# Patient Record
Sex: Female | Born: 1955 | Race: White | Hispanic: No | Marital: Married | Smoking: Never smoker
Health system: Southern US, Community
[De-identification: ages and names within clinical notes are randomized; demographics above are authoritative.]

## PROBLEM LIST (undated history)

## (undated) DIAGNOSIS — R87629 Unspecified abnormal cytological findings in specimens from vagina: Secondary | ICD-10-CM

## (undated) DIAGNOSIS — N879 Dysplasia of cervix uteri, unspecified: Secondary | ICD-10-CM

## (undated) DIAGNOSIS — T7840XA Allergy, unspecified, initial encounter: Secondary | ICD-10-CM

## (undated) DIAGNOSIS — Z87442 Personal history of urinary calculi: Secondary | ICD-10-CM

## (undated) DIAGNOSIS — B029 Zoster without complications: Secondary | ICD-10-CM

## (undated) DIAGNOSIS — M810 Age-related osteoporosis without current pathological fracture: Secondary | ICD-10-CM

## (undated) HISTORY — DX: Age-related osteoporosis without current pathological fracture: M81.0

## (undated) HISTORY — DX: Unspecified abnormal cytological findings in specimens from vagina: R87.629

## (undated) HISTORY — DX: Allergy, unspecified, initial encounter: T78.40XA

## (undated) HISTORY — PX: HAMMER TOE SURGERY: SHX385

## (undated) HISTORY — PX: CATARACT EXTRACTION: SUR2

## (undated) HISTORY — DX: Dysplasia of cervix uteri, unspecified: N87.9

## (undated) HISTORY — PX: EYE SURGERY: SHX253

## (undated) HISTORY — PX: WRIST SURGERY: SHX841

## (undated) HISTORY — PX: OTHER SURGICAL HISTORY: SHX169

## (undated) HISTORY — DX: Zoster without complications: B02.9

## (undated) HISTORY — PX: BREAST BIOPSY: SHX20

---

## 1983-02-11 HISTORY — PX: LASER ABLATION OF THE CERVIX: SHX1949

## 2000-11-02 ENCOUNTER — Other Ambulatory Visit: Admission: RE | Admit: 2000-11-02 | Discharge: 2000-11-02 | Payer: Self-pay | Admitting: Obstetrics and Gynecology

## 2000-12-09 ENCOUNTER — Ambulatory Visit (HOSPITAL_COMMUNITY): Admission: RE | Admit: 2000-12-09 | Discharge: 2000-12-09 | Payer: Self-pay | Admitting: Obstetrics and Gynecology

## 2000-12-09 ENCOUNTER — Encounter: Payer: Self-pay | Admitting: Obstetrics and Gynecology

## 2001-12-10 ENCOUNTER — Ambulatory Visit (HOSPITAL_COMMUNITY): Admission: RE | Admit: 2001-12-10 | Discharge: 2001-12-10 | Payer: Self-pay | Admitting: Obstetrics and Gynecology

## 2001-12-10 ENCOUNTER — Encounter: Payer: Self-pay | Admitting: Obstetrics and Gynecology

## 2003-01-11 ENCOUNTER — Ambulatory Visit (HOSPITAL_COMMUNITY): Admission: RE | Admit: 2003-01-11 | Discharge: 2003-01-11 | Payer: Self-pay | Admitting: Obstetrics and Gynecology

## 2004-01-16 ENCOUNTER — Ambulatory Visit (HOSPITAL_COMMUNITY): Admission: RE | Admit: 2004-01-16 | Discharge: 2004-01-16 | Payer: Self-pay | Admitting: Obstetrics and Gynecology

## 2005-01-23 ENCOUNTER — Ambulatory Visit (HOSPITAL_COMMUNITY): Admission: RE | Admit: 2005-01-23 | Discharge: 2005-01-23 | Payer: Self-pay | Admitting: Obstetrics and Gynecology

## 2006-01-27 ENCOUNTER — Ambulatory Visit (HOSPITAL_COMMUNITY): Admission: RE | Admit: 2006-01-27 | Discharge: 2006-01-27 | Payer: Self-pay | Admitting: Obstetrics and Gynecology

## 2006-02-05 ENCOUNTER — Encounter: Admission: RE | Admit: 2006-02-05 | Discharge: 2006-02-05 | Payer: Self-pay | Admitting: Obstetrics and Gynecology

## 2006-02-11 ENCOUNTER — Encounter (INDEPENDENT_AMBULATORY_CARE_PROVIDER_SITE_OTHER): Payer: Self-pay | Admitting: *Deleted

## 2006-02-11 ENCOUNTER — Encounter: Admission: RE | Admit: 2006-02-11 | Discharge: 2006-02-11 | Payer: Self-pay | Admitting: Obstetrics and Gynecology

## 2006-08-19 ENCOUNTER — Encounter: Admission: RE | Admit: 2006-08-19 | Discharge: 2006-08-19 | Payer: Self-pay | Admitting: Obstetrics and Gynecology

## 2007-02-12 ENCOUNTER — Ambulatory Visit (HOSPITAL_COMMUNITY): Admission: RE | Admit: 2007-02-12 | Discharge: 2007-02-12 | Payer: Self-pay | Admitting: Obstetrics and Gynecology

## 2007-07-07 ENCOUNTER — Other Ambulatory Visit: Admission: RE | Admit: 2007-07-07 | Discharge: 2007-07-07 | Payer: Self-pay | Admitting: Obstetrics and Gynecology

## 2008-02-29 ENCOUNTER — Ambulatory Visit (HOSPITAL_COMMUNITY): Admission: RE | Admit: 2008-02-29 | Discharge: 2008-02-29 | Payer: Self-pay | Admitting: Obstetrics and Gynecology

## 2008-07-07 ENCOUNTER — Other Ambulatory Visit: Admission: RE | Admit: 2008-07-07 | Discharge: 2008-07-07 | Payer: Self-pay | Admitting: Obstetrics and Gynecology

## 2009-03-01 ENCOUNTER — Ambulatory Visit (HOSPITAL_COMMUNITY): Admission: RE | Admit: 2009-03-01 | Discharge: 2009-03-01 | Payer: Self-pay | Admitting: Obstetrics and Gynecology

## 2009-08-01 ENCOUNTER — Other Ambulatory Visit: Admission: RE | Admit: 2009-08-01 | Discharge: 2009-08-01 | Payer: Self-pay | Admitting: Obstetrics and Gynecology

## 2010-03-08 ENCOUNTER — Ambulatory Visit (HOSPITAL_COMMUNITY)
Admission: RE | Admit: 2010-03-08 | Discharge: 2010-03-08 | Payer: Self-pay | Source: Home / Self Care | Attending: Obstetrics and Gynecology | Admitting: Obstetrics and Gynecology

## 2010-08-21 ENCOUNTER — Other Ambulatory Visit: Payer: Self-pay | Admitting: Obstetrics and Gynecology

## 2010-08-21 ENCOUNTER — Other Ambulatory Visit (HOSPITAL_COMMUNITY)
Admission: RE | Admit: 2010-08-21 | Discharge: 2010-08-21 | Disposition: A | Discharge status: Home / Self Care | Payer: 59 | Source: Ambulatory Visit | Attending: Obstetrics and Gynecology | Admitting: Obstetrics and Gynecology

## 2010-08-21 DIAGNOSIS — Z01419 Encounter for gynecological examination (general) (routine) without abnormal findings: Secondary | ICD-10-CM | POA: Insufficient documentation

## 2011-02-10 ENCOUNTER — Other Ambulatory Visit: Payer: Self-pay | Admitting: Obstetrics and Gynecology

## 2011-02-10 DIAGNOSIS — Z139 Encounter for screening, unspecified: Secondary | ICD-10-CM

## 2011-03-10 ENCOUNTER — Ambulatory Visit (HOSPITAL_COMMUNITY)
Admission: RE | Admit: 2011-03-10 | Discharge: 2011-03-10 | Disposition: A | Discharge status: Home / Self Care | Payer: BC Managed Care – PPO | Source: Ambulatory Visit | Attending: Obstetrics and Gynecology | Admitting: Obstetrics and Gynecology

## 2011-03-10 DIAGNOSIS — Z1231 Encounter for screening mammogram for malignant neoplasm of breast: Secondary | ICD-10-CM | POA: Insufficient documentation

## 2011-03-10 DIAGNOSIS — Z139 Encounter for screening, unspecified: Secondary | ICD-10-CM

## 2011-08-27 ENCOUNTER — Other Ambulatory Visit: Payer: Self-pay | Admitting: Obstetrics and Gynecology

## 2011-08-27 ENCOUNTER — Other Ambulatory Visit (HOSPITAL_COMMUNITY)
Admission: RE | Admit: 2011-08-27 | Discharge: 2011-08-27 | Disposition: A | Discharge status: Home / Self Care | Payer: BC Managed Care – PPO | Source: Ambulatory Visit | Attending: Obstetrics and Gynecology | Admitting: Obstetrics and Gynecology

## 2011-08-27 DIAGNOSIS — Z01419 Encounter for gynecological examination (general) (routine) without abnormal findings: Secondary | ICD-10-CM | POA: Insufficient documentation

## 2012-02-10 ENCOUNTER — Other Ambulatory Visit: Payer: Self-pay | Admitting: Obstetrics and Gynecology

## 2012-02-10 DIAGNOSIS — Z139 Encounter for screening, unspecified: Secondary | ICD-10-CM

## 2012-03-11 ENCOUNTER — Ambulatory Visit (HOSPITAL_COMMUNITY): Payer: BC Managed Care – PPO

## 2012-03-16 ENCOUNTER — Ambulatory Visit (HOSPITAL_COMMUNITY): Payer: BC Managed Care – PPO

## 2012-03-23 ENCOUNTER — Ambulatory Visit (HOSPITAL_COMMUNITY)
Admission: RE | Admit: 2012-03-23 | Discharge: 2012-03-23 | Disposition: A | Discharge status: Home / Self Care | Payer: BC Managed Care – PPO | Source: Ambulatory Visit | Attending: Obstetrics and Gynecology | Admitting: Obstetrics and Gynecology

## 2012-03-23 DIAGNOSIS — Z1231 Encounter for screening mammogram for malignant neoplasm of breast: Secondary | ICD-10-CM | POA: Insufficient documentation

## 2012-03-23 DIAGNOSIS — Z139 Encounter for screening, unspecified: Secondary | ICD-10-CM

## 2012-07-27 ENCOUNTER — Telehealth (INDEPENDENT_AMBULATORY_CARE_PROVIDER_SITE_OTHER): Payer: Self-pay | Admitting: *Deleted

## 2012-07-27 ENCOUNTER — Encounter (INDEPENDENT_AMBULATORY_CARE_PROVIDER_SITE_OTHER): Payer: Self-pay | Admitting: *Deleted

## 2012-07-27 ENCOUNTER — Other Ambulatory Visit (INDEPENDENT_AMBULATORY_CARE_PROVIDER_SITE_OTHER): Payer: Self-pay | Admitting: *Deleted

## 2012-07-27 DIAGNOSIS — Z1211 Encounter for screening for malignant neoplasm of colon: Secondary | ICD-10-CM

## 2012-07-27 MED ORDER — PEG-KCL-NACL-NASULF-NA ASC-C 100 G PO SOLR: 1.0000 | Freq: Once | ORAL | Status: DC

## 2012-07-27 NOTE — Telephone Encounter (Signed)
Patient needs movi prep 

## 2012-08-24 ENCOUNTER — Telehealth (INDEPENDENT_AMBULATORY_CARE_PROVIDER_SITE_OTHER): Payer: Self-pay | Admitting: *Deleted

## 2012-08-24 NOTE — Telephone Encounter (Signed)
  Procedure: tcs  Reason/Indication:  screening  Has patient had this procedure before?  no  If so, when, by whom and where?    Is there a family history of colon cancer?  no  Who?  What age when diagnosed?    Is patient diabetic?   no      Does patient have prosthetic heart valve?  no  Do you have a pacemaker?  no  Has patient ever had endocarditis? no  Has patient had joint replacement within last 12 months?  no  Is patient on Coumadin, Plavix and/or Aspirin? no  Medications: vitamins  Allergies: pcn, sulfur  Medication Adjustment:   Procedure date & time: 09/15/12 at 915

## 2012-08-24 NOTE — Telephone Encounter (Signed)
agree

## 2012-09-01 ENCOUNTER — Encounter: Payer: Self-pay | Admitting: Obstetrics and Gynecology

## 2012-09-01 ENCOUNTER — Other Ambulatory Visit (HOSPITAL_COMMUNITY)
Admission: RE | Admit: 2012-09-01 | Discharge: 2012-09-01 | Disposition: A | Discharge status: Home / Self Care | Payer: BC Managed Care – PPO | Source: Ambulatory Visit | Attending: Obstetrics and Gynecology | Admitting: Obstetrics and Gynecology

## 2012-09-01 ENCOUNTER — Ambulatory Visit (INDEPENDENT_AMBULATORY_CARE_PROVIDER_SITE_OTHER): Payer: BC Managed Care – PPO | Admitting: Obstetrics and Gynecology

## 2012-09-01 VITALS — BP 138/80 | Ht 59.0 in | Wt 156.0 lb

## 2012-09-01 DIAGNOSIS — Z01419 Encounter for gynecological examination (general) (routine) without abnormal findings: Secondary | ICD-10-CM | POA: Insufficient documentation

## 2012-09-01 DIAGNOSIS — Z1212 Encounter for screening for malignant neoplasm of rectum: Secondary | ICD-10-CM

## 2012-09-01 DIAGNOSIS — Z1151 Encounter for screening for human papillomavirus (HPV): Secondary | ICD-10-CM | POA: Insufficient documentation

## 2012-09-01 DIAGNOSIS — Z8049 Family history of malignant neoplasm of other genital organs: Secondary | ICD-10-CM | POA: Insufficient documentation

## 2012-09-01 LAB — HEMOCCULT GUIAC POC 1CARD (OFFICE)

## 2012-09-01 NOTE — Progress Notes (Signed)
  Assessment:  Normal Gyn Exam postmeno   Plan:  1. pap smear done, next pap due annually, pt preference 2. return annually or prn  Subjective:  Roberta Barrett is a 57 y.o. female No obstetric history on file. who presents for annual exam.  The patient has complaints today of none  The following portions of the patient's history were reviewed and updated as appropriate: allergies, current medications, past family history, past medical history, past social history, past surgical history and problem list.  Review of Systems Pertinent items are noted in HPI.  Objective:  BP 138/80  Ht 4\' 11"  (1.499 m)  Wt 156 lb (70.761 kg)  BMI 31.49 kg/m2  BMI: Body mass index is 31.49 kg/(m^2). General Appearance: Alert, appropriate appearance for age. No acute distress HEENT: Grossly normal Neck / Thyroid:  Cardiovascular: RRR; normal S1, S2, no murmur Lungs: CTA bilaterally Back: No CVAT Breast Exam: No dimpling, nipple retraction or discharge. No masses or nodes. and No masses or nodes.No dimpling, nipple retraction or discharge. Gastrointestinal: Soft, non-tender, no masses or organomegaly Pelvic Exam: Vulva and vagina appear normal. Bimanual exam reveals normal uterus and adnexa.retroverted uterus, good support Rectovaginal: not indicated and guaiac negative stool obtained Lymphatic Exam: Non-palpable nodes in neck, clavicular, axillary, or inguinal regions Skin: no rash or abnormalities Neurologic: Normal gait and speech, no tremor  Psychiatric: Alert and oriented, appropriate affect.  Urinalysis:normal and Not done Labs reviewed from [facility] hra program Christin Bach. MD Pgr [phone] 12:06 PM

## 2012-09-01 NOTE — Patient Instructions (Signed)
Continue your excellent weight and exercise control.

## 2012-09-07 ENCOUNTER — Encounter (HOSPITAL_COMMUNITY): Payer: Self-pay | Admitting: Pharmacy Technician

## 2012-09-15 ENCOUNTER — Encounter (HOSPITAL_COMMUNITY): Payer: Self-pay | Admitting: *Deleted

## 2012-09-15 ENCOUNTER — Encounter (HOSPITAL_COMMUNITY)
Admission: RE | Disposition: A | Discharge status: Home Health | Payer: Self-pay | Source: Ambulatory Visit | Attending: Internal Medicine

## 2012-09-15 ENCOUNTER — Ambulatory Visit (HOSPITAL_COMMUNITY)
Admission: RE | Admit: 2012-09-15 | Discharge: 2012-09-15 | Disposition: A | Discharge status: Home Health | Payer: BC Managed Care – PPO | Source: Ambulatory Visit | Attending: Internal Medicine | Admitting: Internal Medicine

## 2012-09-15 DIAGNOSIS — J449 Chronic obstructive pulmonary disease, unspecified: Secondary | ICD-10-CM | POA: Insufficient documentation

## 2012-09-15 DIAGNOSIS — K644 Residual hemorrhoidal skin tags: Secondary | ICD-10-CM

## 2012-09-15 DIAGNOSIS — Z1211 Encounter for screening for malignant neoplasm of colon: Secondary | ICD-10-CM | POA: Insufficient documentation

## 2012-09-15 DIAGNOSIS — I10 Essential (primary) hypertension: Secondary | ICD-10-CM | POA: Insufficient documentation

## 2012-09-15 DIAGNOSIS — J4489 Other specified chronic obstructive pulmonary disease: Secondary | ICD-10-CM | POA: Insufficient documentation

## 2012-09-15 HISTORY — PX: COLONOSCOPY: SHX5424

## 2012-09-15 SURGERY — COLONOSCOPY
Anesthesia: Moderate Sedation

## 2012-09-15 MED ORDER — MEPERIDINE HCL 50 MG/ML IJ SOLN
INTRAMUSCULAR | Status: DC | PRN
  Administered 2012-09-15 (×2): 25 mg via INTRAVENOUS

## 2012-09-15 MED ORDER — STERILE WATER FOR IRRIGATION IR SOLN
Status: DC | PRN
  Administered 2012-09-15: 10:00:00

## 2012-09-15 MED ORDER — MIDAZOLAM HCL 5 MG/5ML IJ SOLN
INTRAMUSCULAR | Status: DC | PRN
  Administered 2012-09-15 (×2): 2 mg via INTRAVENOUS
  Administered 2012-09-15: 1 mg via INTRAVENOUS

## 2012-09-15 MED ORDER — MEPERIDINE HCL 50 MG/ML IJ SOLN
INTRAMUSCULAR | Status: AC
  Filled 2012-09-15: qty 1

## 2012-09-15 MED ORDER — SODIUM CHLORIDE 0.9 % IV SOLN
INTRAVENOUS | Status: DC
  Administered 2012-09-15: 1000 mL via INTRAVENOUS

## 2012-09-15 MED ORDER — MIDAZOLAM HCL 5 MG/5ML IJ SOLN
INTRAMUSCULAR | Status: AC
  Filled 2012-09-15: qty 10

## 2012-09-15 NOTE — Op Note (Signed)
COLONOSCOPY PROCEDURE REPORT  PATIENT:  Roberta Barrett  MR#:  [id number] Birthdate:  Oct 09, 1955, 57 y.o., female Endoscopist:  Dr. Malissa Hippo, MD Referred By:  Dr. Rudi Heap, MD  Procedure Date: 09/15/2012  Procedure:   Colonoscopy  Indications:  Patient is 57 year old Caucasian female who is undergoing average risk screening colonoscopy.  Informed Consent:  The procedure and risks were reviewed with the patient and informed consent was obtained.  Medications:  Demerol 50 mg IV Versed 5 mg IV  Description of procedure:  After a digital rectal exam was performed, that colonoscope was advanced from the anus through the rectum and colon to the area of the cecum, ileocecal valve and appendiceal orifice. The cecum was deeply intubated. These structures were well-seen and photographed for the record. From the level of the cecum and ileocecal valve, the scope was slowly and cautiously withdrawn. The mucosal surfaces were carefully surveyed utilizing scope tip to flexion to facilitate fold flattening as needed. The scope was pulled down into the rectum where a thorough exam including retroflexion was performed.  Findings:   Prep excellent. Normal mucosa of  various segments of colon and rectum. Small hemorrhoids below the dentate line.   Therapeutic/Diagnostic Maneuvers Performed:  None  Complications:  None  Cecal Withdrawal Time:  12 minutes  Impression:  Normal colonoscopy except small external hemorrhoids.  Recommendations:  Standard instructions given. Next screening exam in 10 years.  Leocadio Heal U  09/15/2012 9:56 AM  CC: Dr. Rudi Heap, MD & Dr. Bonnetta Barry ref. provider found

## 2012-09-15 NOTE — H&P (Signed)
Roberta Barrett is an 57 y.o. female.   Chief Complaint: Patient's here for colonoscopy. HPI: Patient is 57 year old Caucasian female who is here for average risk screening colonoscopy. She denies abdominal pain change in bowel habits or rectal bleeding. Family history is negative for CRC but her mother has had colonic polyps removed.  History reviewed. No pertinent past medical history.  Past Surgical History  Procedure Laterality Date  . Laser conization    . Laser ablation of the cervix  1985    Family History  Problem Relation Age of Onset  . Hypertension Mother   . COPD Father   . Cancer Daughter    Social History:  reports that she does not drink alcohol or use illicit drugs. Her tobacco history is not on file.  Allergies:  Allergies  Allergen Reactions  . Bee Venom Hives, Shortness Of Breath and Swelling  . Penicillins Rash  . Sulfur Rash    Medications Prior to Admission  Medication Sig Dispense Refill  . cholecalciferol (VITAMIN D) 1000 UNITS tablet Take 2,000 Units by mouth daily.      . Chromium-Cinnamon (CINNAMON PLUS CHROMIUM PO) Take 1 tablet by mouth daily.      Marland Kitchen CRANBERRY EXTRACT PO Take 1 tablet by mouth daily.      . Omega 3-6-9 Fatty Acids (OMEGA 3-6-9 COMPLEX PO) Take 1 capsule by mouth daily.        No results found for this or any previous visit (from the past 48 hour(s)). No results found.  ROS  Blood pressure 131/83, pulse 78, temperature 98.1 F (36.7 C), temperature source Oral, resp. rate 12, height 4\' 11"  (1.499 m), weight 156 lb (70.761 kg), SpO2 97.00%. Physical Exam  Constitutional: She appears well-developed and well-nourished.  HENT:  Mouth/Throat: Oropharynx is clear and moist.  Eyes: Conjunctivae are normal. No scleral icterus.  Neck: No thyromegaly present.  Cardiovascular: Normal rate, regular rhythm and normal heart sounds.   No murmur heard. Respiratory: Effort normal and breath sounds normal.  GI: Soft. Bowel sounds are  normal.  Musculoskeletal: She exhibits no edema.  Lymphadenopathy:    She has no cervical adenopathy.  Neurological: She is alert.  Skin: Skin is warm and dry.     Assessment/Plan Average risk screening colonoscopy.  Roberta Barrett U 09/15/2012, 9:27 AM

## 2012-09-17 ENCOUNTER — Encounter (HOSPITAL_COMMUNITY): Payer: Self-pay | Admitting: Internal Medicine

## 2012-11-02 ENCOUNTER — Telehealth: Payer: Self-pay | Admitting: Adult Health

## 2012-11-03 ENCOUNTER — Other Ambulatory Visit: Payer: Self-pay | Admitting: Adult Health

## 2012-11-03 MED ORDER — EPINEPHRINE 0.3 MG/0.3ML IJ SOAJ: 0.3000 mg | Freq: Once | INTRAMUSCULAR | Status: DC

## 2012-11-03 NOTE — Telephone Encounter (Signed)
Pt requesting refill on Epi-pen, stated Cyril Mourning, NP had refilled in past. Pt states leaving to go out of the state tomorrow.

## 2012-11-03 NOTE — Telephone Encounter (Signed)
Left message epi pen refilled

## 2013-02-15 ENCOUNTER — Other Ambulatory Visit: Payer: Self-pay | Admitting: Obstetrics and Gynecology

## 2013-02-15 DIAGNOSIS — Z139 Encounter for screening, unspecified: Secondary | ICD-10-CM

## 2013-03-24 ENCOUNTER — Ambulatory Visit (HOSPITAL_COMMUNITY): Payer: BC Managed Care – PPO

## 2013-03-25 ENCOUNTER — Ambulatory Visit (HOSPITAL_COMMUNITY)
Admission: RE | Admit: 2013-03-25 | Discharge: 2013-03-25 | Disposition: A | Discharge status: Home / Self Care | Payer: BC Managed Care – PPO | Source: Ambulatory Visit | Attending: Obstetrics and Gynecology | Admitting: Obstetrics and Gynecology

## 2013-03-25 DIAGNOSIS — Z139 Encounter for screening, unspecified: Secondary | ICD-10-CM

## 2013-03-25 DIAGNOSIS — Z1231 Encounter for screening mammogram for malignant neoplasm of breast: Secondary | ICD-10-CM | POA: Insufficient documentation

## 2013-09-05 ENCOUNTER — Encounter: Payer: Self-pay | Admitting: Obstetrics and Gynecology

## 2013-09-05 ENCOUNTER — Ambulatory Visit (INDEPENDENT_AMBULATORY_CARE_PROVIDER_SITE_OTHER): Payer: BC Managed Care – PPO | Admitting: Obstetrics and Gynecology

## 2013-09-05 ENCOUNTER — Other Ambulatory Visit (HOSPITAL_COMMUNITY)
Admission: RE | Admit: 2013-09-05 | Discharge: 2013-09-05 | Disposition: A | Discharge status: Home / Self Care | Payer: BC Managed Care – PPO | Source: Ambulatory Visit | Attending: Obstetrics and Gynecology | Admitting: Obstetrics and Gynecology

## 2013-09-05 VITALS — BP 138/86 | Ht 60.0 in | Wt 159.0 lb

## 2013-09-05 DIAGNOSIS — Z01419 Encounter for gynecological examination (general) (routine) without abnormal findings: Secondary | ICD-10-CM | POA: Insufficient documentation

## 2013-09-05 DIAGNOSIS — Z1151 Encounter for screening for human papillomavirus (HPV): Secondary | ICD-10-CM | POA: Insufficient documentation

## 2013-09-05 DIAGNOSIS — N898 Other specified noninflammatory disorders of vagina: Secondary | ICD-10-CM

## 2013-09-05 DIAGNOSIS — Z1212 Encounter for screening for malignant neoplasm of rectum: Secondary | ICD-10-CM

## 2013-09-05 DIAGNOSIS — N952 Postmenopausal atrophic vaginitis: Secondary | ICD-10-CM

## 2013-09-05 LAB — POCT WET PREP (WET MOUNT)
Bacteria Wet Prep HPF POC: NORMAL
TRICHOMONAS WET PREP HPF POC: NEGATIVE

## 2013-09-05 LAB — HEMOCCULT GUIAC POC 1CARD (OFFICE): Fecal Occult Blood, POC: NEGATIVE

## 2013-09-05 MED ORDER — ESTROGENS, CONJUGATED 0.625 MG/GM VA CREA
0.5000 g | TOPICAL_CREAM | Freq: Every day | VAGINAL | Status: DC
Start: 2013-09-05 — End: 2014-05-22

## 2013-09-05 NOTE — Progress Notes (Signed)
This chart was scribed by Leone PayorSonum Patel, Medical Scribe, for Dr. Christin BachJohn Jader Desai on 09/05/13 at 9:55 AM. This chart was reviewed by Dr. Christin BachJohn Jasime Westergren for accuracy.  Assessment:  Annual Gyn Exam Atrophic vaginitis., pt offered Premarin VC and accepts bid premarin VC 0.5 gm pv.   Plan:  1. pap smear done, next pap due 3 yr 2. return annually or prn 3    Annual mammogram advised Subjective:  Roberta LeatherwoodRobin P Barrett is a 58 y.o. female No obstetric history on file. who presents for annual exam. No LMP recorded. Patient is postmenopausal. The patient denies any problems or concerns at this time. She declines consideration of HT . Vag discharge noted, yellow  The following portions of the patient's history were reviewed and updated as appropriate: allergies, current medications, past family history, past medical history, past social history, past surgical history and problem list. Past Medical History  Diagnosis Date  . Dysplasia of cervix, unspecified 80's    Past Surgical History  Procedure Laterality Date  . Laser conization    . Laser ablation of the cervix  1985  . Colonoscopy N/A 09/15/2012    Procedure: COLONOSCOPY;  Surgeon: Malissa HippoNajeeb U Rehman, MD;  Location: [facility];  Service: Endoscopy;  Laterality: N/A;  915    Current outpatient prescriptions:cholecalciferol (VITAMIN D) 1000 UNITS tablet, Take 2,000 Units by mouth daily., Disp: , Rfl: ;  CRANBERRY EXTRACT PO, Take 1 tablet by mouth daily., Disp: , Rfl: ;  Omega 3-6-9 Fatty Acids (OMEGA 3-6-9 COMPLEX PO), Take 1 capsule by mouth daily., Disp: , Rfl: ;  EPINEPHrine (EPI-PEN) 0.3 mg/0.3 mL SOAJ injection, Inject 0.3 mLs (0.3 mg total) into the muscle once., Disp: 1 Device, Rfl: 1  Review of Systems Constitutional: negative Gastrointestinal: negative Genitourinary: negative   Objective:  BP 138/86  Ht 5' (1.524 m)  Wt 159 lb (72.122 kg)  BMI 31.05 kg/m2   BMI: Body mass index is 31.05 kg/(m^2).  General Appearance: Alert, appropriate  appearance for age. No acute distress HEENT: Grossly normal Neck / Thyroid:  Cardiovascular: RRR; normal S1, S2, no murmur Lungs: CTA bilaterally Back: No CVAT Breast Exam: No dimpling, nipple retraction or discharge. No masses or nodes., Normal to inspection, Normal breast tissue bilaterally and No masses or nodes.No dimpling, nipple retraction or discharge. Gastrointestinal: Soft, non-tender, no masses or organomegaly Pelvic Exam: External genitalia: normal general appearance Vaginal: atrophic mucosa and discharge, yellow Cervix: normal appearance Adnexa: normal bimanual exam Uterus: normal single, nontender and anteverted Rectovaginal: not indicated, normal rectal, no masses and guaiac negative stool obtained Lymphatic Exam: Non-palpable nodes in neck, clavicular, axillary, or inguinal regions Skin: no rash or abnormalities Neurologic: Normal gait and speech, no tremor  Psychiatric: Alert and oriented, appropriate affect.  Urinalysis:Not done  Christin BachJohn Zoe Creasman. MD Pgr [phone] 9:55 AM

## 2013-09-05 NOTE — Patient Instructions (Signed)

## 2013-09-06 LAB — CYTOLOGY - PAP

## 2013-12-07 ENCOUNTER — Ambulatory Visit: Payer: BC Managed Care – PPO | Admitting: Obstetrics and Gynecology

## 2014-02-27 ENCOUNTER — Other Ambulatory Visit: Payer: Self-pay | Admitting: Obstetrics and Gynecology

## 2014-02-27 DIAGNOSIS — Z1231 Encounter for screening mammogram for malignant neoplasm of breast: Secondary | ICD-10-CM

## 2014-03-29 ENCOUNTER — Ambulatory Visit (HOSPITAL_COMMUNITY)
Admission: RE | Admit: 2014-03-29 | Discharge: 2014-03-29 | Disposition: A | Discharge status: Home / Self Care | Payer: BLUE CROSS/BLUE SHIELD | Source: Ambulatory Visit | Attending: Obstetrics and Gynecology | Admitting: Obstetrics and Gynecology

## 2014-03-29 DIAGNOSIS — Z1231 Encounter for screening mammogram for malignant neoplasm of breast: Secondary | ICD-10-CM | POA: Insufficient documentation

## 2014-04-07 ENCOUNTER — Ambulatory Visit (INDEPENDENT_AMBULATORY_CARE_PROVIDER_SITE_OTHER): Payer: BLUE CROSS/BLUE SHIELD | Admitting: Urology

## 2014-04-07 DIAGNOSIS — N3941 Urge incontinence: Secondary | ICD-10-CM

## 2014-04-07 DIAGNOSIS — Z87440 Personal history of urinary (tract) infections: Secondary | ICD-10-CM

## 2014-05-18 ENCOUNTER — Telehealth: Payer: Self-pay | Admitting: Family Medicine

## 2014-05-18 NOTE — Telephone Encounter (Signed)
Appointment scheduled for Monday with Tiffany.

## 2014-05-22 ENCOUNTER — Ambulatory Visit (INDEPENDENT_AMBULATORY_CARE_PROVIDER_SITE_OTHER): Payer: BLUE CROSS/BLUE SHIELD | Admitting: Physician Assistant

## 2014-05-22 ENCOUNTER — Encounter: Payer: Self-pay | Admitting: Physician Assistant

## 2014-05-22 VITALS — BP 137/82 | HR 80 | Temp 97.4°F | Ht 60.0 in | Wt 162.0 lb

## 2014-05-22 DIAGNOSIS — M79605 Pain in left leg: Secondary | ICD-10-CM

## 2014-05-22 DIAGNOSIS — M79602 Pain in left arm: Secondary | ICD-10-CM

## 2014-05-22 MED ORDER — EPINEPHRINE 0.3 MG/0.3ML IJ SOAJ: 0.3000 mg | Freq: Once | INTRAMUSCULAR | Status: DC

## 2014-05-22 NOTE — Progress Notes (Signed)
   Subjective:    Patient ID: Roberta Barrett, female    DOB: 29-Aug-1955, 59 y.o.   MRN: [id number]  HPI 59 y/o female presents with pain in left leg after standing for long periods of time. Has tried occasional aleve which relieves the pain.     Review of Systems  Musculoskeletal: Positive for myalgias (pain in left lower leg).  Skin:       Mild, superficial varicose veins BLE  Neurological: Positive for weakness.  All other systems reviewed and are negative.      Objective:   Physical Exam  Constitutional: She is oriented to person, place, and time.  Musculoskeletal: Normal range of motion. She exhibits no edema or tenderness.  Neurological: She is alert and oriented to person, place, and time.  Skin: No rash noted. No erythema. No pallor.  No discoloration No ttp No edema Superficial varicose veins noted on exam   Nursing note and vitals reviewed.         Assessment & Plan:  1. Left leg pain: 2 Aleve BID x 14 days, then 1 Aleve BID x 14 days. F/U in 4 weeks for reassessment.

## 2014-05-22 NOTE — Patient Instructions (Signed)
Take 2 aleve twice daily x 2 weeks, then 1 aleve once daily x 2 weeks. Rest, ice, elevate when sitting.

## 2014-06-21 ENCOUNTER — Ambulatory Visit: Payer: BLUE CROSS/BLUE SHIELD | Admitting: Physician Assistant

## 2014-09-08 ENCOUNTER — Other Ambulatory Visit: Payer: BLUE CROSS/BLUE SHIELD | Admitting: Obstetrics and Gynecology

## 2014-09-13 ENCOUNTER — Other Ambulatory Visit (HOSPITAL_COMMUNITY)
Admission: RE | Admit: 2014-09-13 | Discharge: 2014-09-13 | Disposition: A | Discharge status: Home / Self Care | Payer: BLUE CROSS/BLUE SHIELD | Source: Ambulatory Visit | Attending: Obstetrics and Gynecology | Admitting: Obstetrics and Gynecology

## 2014-09-13 ENCOUNTER — Ambulatory Visit (INDEPENDENT_AMBULATORY_CARE_PROVIDER_SITE_OTHER): Payer: BLUE CROSS/BLUE SHIELD | Admitting: Obstetrics and Gynecology

## 2014-09-13 ENCOUNTER — Encounter: Payer: Self-pay | Admitting: Obstetrics and Gynecology

## 2014-09-13 VITALS — BP 142/80 | Ht 60.0 in | Wt 158.5 lb

## 2014-09-13 DIAGNOSIS — Z1151 Encounter for screening for human papillomavirus (HPV): Secondary | ICD-10-CM | POA: Insufficient documentation

## 2014-09-13 DIAGNOSIS — Z0000 Encounter for general adult medical examination without abnormal findings: Secondary | ICD-10-CM

## 2014-09-13 DIAGNOSIS — Z01419 Encounter for gynecological examination (general) (routine) without abnormal findings: Secondary | ICD-10-CM | POA: Diagnosis not present

## 2014-09-13 NOTE — Addendum Note (Signed)
Addended by: Richardson Chiquito on: 09/13/2014 10:04 AM   Modules accepted: Orders

## 2014-09-13 NOTE — Progress Notes (Signed)
Patient ID: Roberta Barrett, female   DOB: May 26, 1955, 59 y.o.   MRN: [id number]   Assessment:  Annual Gyn Exam   Plan:  1. pap smear done, next pap due annually (per patient) 2. return annually or prn 3    Annual mammogram advised Subjective:  Roberta Barrett is a 59 y.o. female No obstetric history on file. who presents for annual exam. No LMP recorded. Patient is postmenopausal. The patient has no complaints today. She is currently retired. Pt has a family history of cervical cancer (sister). Which metastasized  The following portions of the patient's history were reviewed and updated as appropriate: allergies, current medications, past family history, past medical history, past social history, past surgical history and problem list. Past Medical History  Diagnosis Date  . Dysplasia of cervix, unspecified 80's    Past Surgical History  Procedure Laterality Date  . Laser conization    . Laser ablation of the cervix  1985  . Colonoscopy N/A 09/15/2012    Procedure: COLONOSCOPY;  Surgeon: Malissa Hippo, MD;  Location: [facility];  Service: Endoscopy;  Laterality: N/A;  915     Current outpatient prescriptions:  .  cholecalciferol (VITAMIN D) 1000 UNITS tablet, Take 2,000 Units by mouth daily., Disp: , Rfl:  .  CRANBERRY EXTRACT PO, Take 1 tablet by mouth daily., Disp: , Rfl:  .  Omega 3-6-9 Fatty Acids (OMEGA 3-6-9 COMPLEX PO), Take 1 capsule by mouth daily., Disp: , Rfl:  .  Red Yeast Rice Extract (RED YEAST RICE PO), Take 1 tablet by mouth daily., Disp: , Rfl:  .  EPINEPHrine 0.3 mg/0.3 mL IJ SOAJ injection, Inject 0.3 mLs (0.3 mg total) into the muscle once. (Patient not taking: Reported on 09/13/2014), Disp: 1 Device, Rfl: 1  Review of Systems Constitutional: negative Gastrointestinal: negative Genitourinary: negative  Objective:  BP 142/80 mmHg  Ht 5' (1.524 m)  Wt 158 lb 8 oz (71.895 kg)  BMI 30.95 kg/m2   BMI: Body mass index is 30.95 kg/(m^2).  General  Appearance: Alert, appropriate appearance for age. No acute distress HEENT: Grossly normal Neck / Thyroid:  Cardiovascular: RRR; normal S1, S2, no murmur Lungs: CTA bilaterally Back: No CVAT Breast Exam: No dimpling, nipple retraction or discharge. No masses or nodes., Normal to inspection and Normal breast tissue bilaterally Gastrointestinal: Soft, non-tender, no masses or organomegaly Pelvic Exam: Vulva and vagina appear normal. Bimanual exam reveals normal uterus and adnexa. External genitalia: normal general appearance Vaginal: normal mucosa without prolapse or lesions and normal without tenderness, induration or masses atrophic tissues friable with speculum swipe Cervix: normal appearance Adnexa: normal bimanual exam Uterus: normal single, nontender; atrophic tissues, good support Rectovaginal: normal rectal, no masses and guaiac negative stool obtained Lymphatic Exam: Non-palpable nodes in neck, clavicular, axillary, or inguinal regions  Skin: no rash or abnormalities Neurologic: Normal gait and speech, no tremor  Psychiatric: Alert and oriented, appropriate affect.  Urinalysis:Not done Hemoccult: Negative  Christin Bach. MD Pgr [phone] 9:25 AM  This chart was scribed for Tilda Burrow, MD by Gwenyth Ober, Medical Scribe. This patient was seen in room 1 and the patient's care was started at 9:25 AM.    I personally performed the services described in this documentation, which was SCRIBED in my presence. The recorded information has been reviewed and considered accurate. It has been edited as necessary during review. Tilda Burrow, MD

## 2014-09-13 NOTE — Progress Notes (Signed)
Patient ID: Roberta Barrett, female   DOB: [birth year], 59 y.o.   MRN: [id number] Pt here today for her annual exam. Pt denies any problems or concerns at this time.

## 2014-09-14 LAB — CBC WITH DIFFERENTIAL/PLATELET
Basophils Absolute: 0.1 10*3/uL (ref 0.0–0.2)
Basos: 1 %
EOS (ABSOLUTE): 0.2 10*3/uL (ref 0.0–0.4)
EOS: 4 %
HEMATOCRIT: 39.5 % (ref 34.0–46.6)
HEMOGLOBIN: 13.2 g/dL (ref 11.1–15.9)
IMMATURE GRANS (ABS): 0 10*3/uL (ref 0.0–0.1)
IMMATURE GRANULOCYTES: 0 %
LYMPHS: 37 %
Lymphocytes Absolute: 1.7 10*3/uL (ref 0.7–3.1)
MCH: 26.7 pg (ref 26.6–33.0)
MCHC: 33.4 g/dL (ref 31.5–35.7)
MCV: 80 fL (ref 79–97)
MONOCYTES: 11 %
MONOS ABS: 0.5 10*3/uL (ref 0.1–0.9)
NEUTROS PCT: 47 %
Neutrophils Absolute: 2.2 10*3/uL (ref 1.4–7.0)
Platelets: 167 10*3/uL (ref 150–379)
RBC: 4.95 x10E6/uL (ref 3.77–5.28)
RDW: 15.7 % — ABNORMAL HIGH (ref 12.3–15.4)
WBC: 4.7 10*3/uL (ref 3.4–10.8)

## 2014-09-14 LAB — LIPID PANEL
CHOL/HDL RATIO: 3.9 ratio (ref 0.0–4.4)
CHOLESTEROL TOTAL: 197 mg/dL (ref 100–199)
HDL: 50 mg/dL (ref >39)
LDL Calculated: 111 mg/dL — ABNORMAL HIGH (ref 0–99)
TRIGLYCERIDES: 182 mg/dL — AB (ref 0–149)
VLDL Cholesterol Cal: 36 mg/dL (ref 5–40)

## 2014-09-14 LAB — COMPREHENSIVE METABOLIC PANEL
A/G RATIO: 1.5 (ref 1.1–2.5)
ALBUMIN: 4.5 g/dL (ref 3.5–5.5)
ALT: 14 IU/L (ref 0–32)
AST: 19 IU/L (ref 0–40)
Alkaline Phosphatase: 91 IU/L (ref 39–117)
BILIRUBIN TOTAL: 0.7 mg/dL (ref 0.0–1.2)
BUN / CREAT RATIO: 15 (ref 9–23)
BUN: 14 mg/dL (ref 6–24)
CALCIUM: 9.5 mg/dL (ref 8.7–10.2)
CO2: 22 mmol/L (ref 18–29)
CREATININE: 0.91 mg/dL (ref 0.57–1.00)
Chloride: 101 mmol/L (ref 97–108)
GFR calc Af Amer: 80 mL/min/{1.73_m2} (ref >59)
GFR calc non Af Amer: 70 mL/min/{1.73_m2} (ref >59)
Globulin, Total: 3.1 g/dL (ref 1.5–4.5)
Glucose: 92 mg/dL (ref 65–99)
Potassium: 5.3 mmol/L — ABNORMAL HIGH (ref 3.5–5.2)
SODIUM: 141 mmol/L (ref 134–144)
TOTAL PROTEIN: 7.6 g/dL (ref 6.0–8.5)

## 2014-09-14 LAB — TSH: TSH: 1.54 u[IU]/mL (ref 0.450–4.500)

## 2014-09-14 LAB — CYTOLOGY - PAP

## 2014-10-03 ENCOUNTER — Telehealth: Payer: Self-pay | Admitting: Obstetrics and Gynecology

## 2014-10-05 NOTE — Telephone Encounter (Signed)
i cannot change codes from screening to something else, it WAS screening

## 2014-10-06 NOTE — Telephone Encounter (Signed)
I contacted the pt and made her aware that Dr. Emelda Fear used a screening code and that he could not change the code to anything else. Pt verbalized understanding.

## 2014-12-08 ENCOUNTER — Encounter: Payer: Self-pay | Admitting: Adult Health

## 2014-12-08 ENCOUNTER — Ambulatory Visit (INDEPENDENT_AMBULATORY_CARE_PROVIDER_SITE_OTHER): Payer: BLUE CROSS/BLUE SHIELD | Admitting: Adult Health

## 2014-12-08 VITALS — BP 164/82 | HR 76 | Ht 60.0 in | Wt 160.0 lb

## 2014-12-08 DIAGNOSIS — B029 Zoster without complications: Secondary | ICD-10-CM

## 2014-12-08 HISTORY — DX: Zoster without complications: B02.9

## 2014-12-08 MED ORDER — VALACYCLOVIR HCL 1 G PO TABS: 1000.0000 mg | ORAL_TABLET | Freq: Three times a day (TID) | ORAL | Status: DC

## 2014-12-08 NOTE — Patient Instructions (Signed)

## 2014-12-08 NOTE — Progress Notes (Signed)
Subjective:     Patient ID: Roberta Barrett, female   DOB: 1956/02/01, 59 y.o.   MRN: [id number]  HPI Roberta Barrett is a 59 year old white female,married, worked in today for complaints of pain and pressure in left labia area since Tuesday and has spot on left buttock,she wonders if shingles.She has felt bad and achy.   Review of Systems Patient denies any headaches, hearing loss, fatigue, blurred vision, shortness of breath, chest pain, abdominal pain, problems with bowel movements, urination, or intercourse. No joint pain or mood swings.See HPI for positives.  Reviewed past medical,surgical, social and family history. Reviewed medications and allergies.     Objective:   Physical Exam BP 164/82 mmHg  Pulse 76  Ht 5' (1.524 m)  Wt 160 lb (72.576 kg)  BMI 31.25 kg/m2 Skin warm and dry.Pelvic: external genitalia has vesicles left labia, from clitoris to introitus and 1 spot left buttock, left labia swollen,firm like, and light red and very tender,culture swab used to area, vagina: has decreased moisture and rugae,atrophic,No lesions noted,urethra has no lesions or masses noted, cervix:smooth, uterus: normal size, shape and contour, non tender, no masses felt, adnexa: no masses or tenderness noted. Bladder is non tender and no masses felt. Dr Despina HiddenEure in for co exam   Discussed with her that it has herpetic look, will treat for shingles.She declines pain meds, wear loose clothing.   Assessment:     Shingles    Plan:    HSV culture sent Rx valtrex 1 gm #42 take 1 tid x 14 days Follow up in 10 days Review handout on shingles   Can use tylenol or advil for pain

## 2014-12-12 ENCOUNTER — Telehealth: Payer: Self-pay | Admitting: Adult Health

## 2014-12-12 LAB — HERPES SIMPLEX VIRUS CULTURE

## 2014-12-12 NOTE — Telephone Encounter (Signed)
Left message culture negative

## 2014-12-19 ENCOUNTER — Encounter: Payer: Self-pay | Admitting: Adult Health

## 2014-12-19 ENCOUNTER — Ambulatory Visit (INDEPENDENT_AMBULATORY_CARE_PROVIDER_SITE_OTHER): Payer: BLUE CROSS/BLUE SHIELD | Admitting: Adult Health

## 2014-12-19 VITALS — BP 140/86 | HR 80 | Ht 60.0 in | Wt 158.5 lb

## 2014-12-19 DIAGNOSIS — B029 Zoster without complications: Secondary | ICD-10-CM

## 2014-12-19 NOTE — Patient Instructions (Signed)
Follow up prn

## 2014-12-19 NOTE — Progress Notes (Signed)
Subjective:     Patient ID: Roberta Barrett, female   DOB: [birth year], 59 y.o.   MRN: [id number]  HPI Roberta Barrett is a 59 year old white female in for recheck of shingles left labia and buttock,taking valtrex,feels much better,but some discomfort still left labia.  Review of Systems Patient denies any headaches, hearing loss, fatigue, blurred vision, shortness of breath, chest pain, abdominal pain, problems with bowel movements, urination, or intercourse. No joint pain or mood swings. Reviewed past medical,surgical, social and family history. Reviewed medications and allergies.     Objective:   Physical Exam BP 140/86 mmHg  Pulse 80  Ht 5' (1.524 m)  Wt 158 lb 8 oz (71.895 kg)  BMI 30.95 kg/m2   Area of shingles on left labia and buttock have resolved,still has some discomfort left labia at times,no swelling,redness or vesicles now. Discussed this discomfort can continue for weeks to months,but she says it is so much better.  Assessment:     Shingles, resolved    Plan:     Finish valtrex Follow up prn

## 2015-02-02 ENCOUNTER — Ambulatory Visit (INDEPENDENT_AMBULATORY_CARE_PROVIDER_SITE_OTHER): Payer: BLUE CROSS/BLUE SHIELD | Admitting: Family

## 2015-02-02 ENCOUNTER — Encounter: Payer: Self-pay | Admitting: Family

## 2015-02-02 VITALS — BP 155/83 | HR 107 | Temp 98.8°F | Ht 61.52 in | Wt 159.2 lb

## 2015-02-02 DIAGNOSIS — J069 Acute upper respiratory infection, unspecified: Secondary | ICD-10-CM | POA: Diagnosis not present

## 2015-02-02 DIAGNOSIS — J029 Acute pharyngitis, unspecified: Secondary | ICD-10-CM

## 2015-02-02 LAB — POCT RAPID STREP A (OFFICE): Rapid Strep A Screen: NEGATIVE

## 2015-02-02 MED ORDER — FLUTICASONE PROPIONATE 50 MCG/ACT NA SUSP: 2.0000 | Freq: Every day | NASAL | Status: DC

## 2015-02-02 MED ORDER — AZITHROMYCIN 250 MG PO TABS: ORAL_TABLET | ORAL | Status: DC

## 2015-02-02 NOTE — Progress Notes (Signed)
Subjective:    Patient ID: Roberta Barrett, female    DOB: 11/20/1955, 59 y.o.   MRN: [id number]  Sore Throat  This is a new problem. The current episode started yesterday. The problem has been waxing and waning. The maximum temperature recorded prior to her arrival was 100.4 - 100.9 F. The pain is at a severity of 5/10. The pain is mild. Associated symptoms include congestion, headaches, a hoarse voice, a plugged ear sensation, swollen glands and trouble swallowing. Pertinent negatives include no coughing, ear discharge, ear pain or shortness of breath. She has had no exposure to strep or mono. She has tried NSAIDs for the symptoms. The treatment provided mild relief.  Fever  Associated symptoms include congestion and headaches. Pertinent negatives include no coughing or ear pain.      Review of Systems  Constitutional: Positive for fever.  HENT: Positive for congestion, hoarse voice and trouble swallowing. Negative for ear discharge and ear pain.   Eyes: Negative.   Respiratory: Negative.  Negative for cough and shortness of breath.   Cardiovascular: Negative.  Negative for palpitations.  Gastrointestinal: Negative.   Endocrine: Negative.   Genitourinary: Negative.   Musculoskeletal: Negative.   Neurological: Positive for headaches.  Hematological: Negative.   Psychiatric/Behavioral: Negative.   All other systems reviewed and are negative.      Objective:   Physical Exam  Constitutional: She is oriented to person, place, and time. She appears well-developed and well-nourished. No distress.  HENT:  Head: Normocephalic and atraumatic.  Right Ear: External ear normal.  Left Ear: External ear normal.  Nasal passage erythemas with moderate swelling Oropharynx erythemas    Eyes: Pupils are equal, round, and reactive to light.  Neck: Normal range of motion. Neck supple. No thyromegaly present.  Cardiovascular: Normal rate, regular rhythm, normal heart sounds and intact distal  pulses.   No murmur heard. Pulmonary/Chest: Effort normal and breath sounds normal. No respiratory distress. She has no wheezes.  Abdominal: Soft. Bowel sounds are normal. She exhibits no distension. There is no tenderness.  Musculoskeletal: Normal range of motion. She exhibits no edema or tenderness.  Neurological: She is alert and oriented to person, place, and time. She has normal reflexes. No cranial nerve deficit.  Skin: Skin is warm and dry.  Psychiatric: She has a normal mood and affect. Her behavior is normal. Judgment and thought content normal.  Vitals reviewed.     BP 155/83 mmHg  Pulse 107  Temp(Src) 98.8 F (37.1 C) (Oral)  Ht 5' 1.52" (1.563 m)  Wt 159 lb 3.2 oz (72.213 kg)  BMI 29.56 kg/m2     Assessment & Plan:  1. Sore throat - POCT rapid strep A - Culture, Group A Strep  2. Acute upper respiratory infection -- Take meds as prescribed - Use a cool mist humidifier  -Use saline nose sprays frequently -Saline irrigations of the nose can be very helpful if done frequently.  * 4X daily for 1 week*  * Use of a nettie pot can be helpful with this. Follow directions with this* -Force fluids -For any cough or congestion  Use plain Mucinex- regular strength or max strength is fine   * Children- consult with Pharmacist for dosing -For fever or aces or pains- take tylenol or ibuprofen appropriate for age and weight.  * for fevers greater than 101 orally you may alternate ibuprofen and tylenol every  3 hours. -Throat lozenges if help -New toothbrush in 3 days - azithromycin (ZITHROMAX Z-PAK)  250 MG tablet; As directed  Dispense: 1 each; Refill: 0 - fluticasone (FLONASE) 50 MCG/ACT nasal spray; Place 2 sprays into both nostrils daily.  Dispense: 16 g; Refill: 6  Jannifer Rodneyhristy Daton Szilagyi, FNP

## 2015-02-02 NOTE — Patient Instructions (Signed)
Upper Respiratory Infection, Adult Most upper respiratory infections (URIs) are a viral infection of the air passages leading to the lungs. A URI affects the nose, throat, and upper air passages. The most common type of URI is nasopharyngitis and is typically referred to as "the common cold." URIs run their course and usually go away on their own. Most of the time, a URI does not require medical attention, but sometimes a bacterial infection in the upper airways can follow a viral infection. This is called a secondary infection. Sinus and middle ear infections are common types of secondary upper respiratory infections. Bacterial pneumonia can also complicate a URI. A URI can worsen asthma and chronic obstructive pulmonary disease (COPD). Sometimes, these complications can require emergency medical care and may be life threatening.  CAUSES Almost all URIs are caused by viruses. A virus is a type of germ and can spread from one person to another.  RISKS FACTORS You may be at risk for a URI if:   You smoke.   You have chronic heart or lung disease.  You have a weakened defense (immune) system.   You are very young or very old.   You have nasal allergies or asthma.  You work in crowded or poorly ventilated areas.  You work in health care facilities or schools. SIGNS AND SYMPTOMS  Symptoms typically develop 2-3 days after you come in contact with a cold virus. Most viral URIs last 7-10 days. However, viral URIs from the influenza virus (flu virus) can last 14-18 days and are typically more severe. Symptoms may include:   Runny or stuffy (congested) nose.   Sneezing.   Cough.   Sore throat.   Headache.   Fatigue.   Fever.   Loss of appetite.   Pain in your forehead, behind your eyes, and over your cheekbones (sinus pain).  Muscle aches.  DIAGNOSIS  Your health care provider may diagnose a URI by:  Physical exam.  Tests to check that your symptoms are not due to  another condition such as:  Strep throat.  Sinusitis.  Pneumonia.  Asthma. TREATMENT  A URI goes away on its own with time. It cannot be cured with medicines, but medicines may be prescribed or recommended to relieve symptoms. Medicines may help:  Reduce your fever.  Reduce your cough.  Relieve nasal congestion. HOME CARE INSTRUCTIONS   Take medicines only as directed by your health care provider.   Gargle warm saltwater or take cough drops to comfort your throat as directed by your health care provider.  Use a warm mist humidifier or inhale steam from a shower to increase air moisture. This may make it easier to breathe.  Drink enough fluid to keep your urine clear or pale yellow.   Eat soups and other clear broths and maintain good nutrition.   Rest as needed.   Return to work when your temperature has returned to normal or as your health care provider advises. You may need to stay home longer to avoid infecting others. You can also use a face mask and careful hand washing to prevent spread of the virus.  Increase the usage of your inhaler if you have asthma.   Do not use any tobacco products, including cigarettes, chewing tobacco, or electronic cigarettes. If you need help quitting, ask your health care provider. PREVENTION  The best way to protect yourself from getting a cold is to practice good hygiene.   Avoid oral or hand contact with people with cold   symptoms.   Wash your hands often if contact occurs.  There is no clear evidence that vitamin C, vitamin E, echinacea, or exercise reduces the chance of developing a cold. However, it is always recommended to get plenty of rest, exercise, and practice good nutrition.  SEEK MEDICAL CARE IF:   You are getting worse rather than better.   Your symptoms are not controlled by medicine.   You have chills.  You have worsening shortness of breath.  You have brown or red mucus.  You have yellow or brown nasal  discharge.  You have pain in your face, especially when you bend forward.  You have a fever.  You have swollen neck glands.  You have pain while swallowing.  You have white areas in the back of your throat. SEEK IMMEDIATE MEDICAL CARE IF:   You have severe or persistent:  Headache.  Ear pain.  Sinus pain.  Chest pain.  You have chronic lung disease and any of the following:  Wheezing.  Prolonged cough.  Coughing up blood.  A change in your usual mucus.  You have a stiff neck.  You have changes in your:  Vision.  Hearing.  Thinking.  Mood. MAKE SURE YOU:   Understand these instructions.  Will watch your condition.  Will get help right away if you are not doing well or get worse.   This information is not intended to replace advice given to you by your health care provider. Make sure you discuss any questions you have with your health care provider.   Document Released: 07/23/2000 Document Revised: 06/13/2014 Document Reviewed: 05/04/2013 Elsevier Interactive Patient Education 2016 Elsevier Inc.  - Take meds as prescribed - Use a cool mist humidifier  -Use saline nose sprays frequently -Saline irrigations of the nose can be very helpful if done frequently.  * 4X daily for 1 week*  * Use of a nettie pot can be helpful with this. Follow directions with this* -Force fluids -For any cough or congestion  Use plain Mucinex- regular strength or max strength is fine   * Children- consult with Pharmacist for dosing -For fever or aces or pains- take tylenol or ibuprofen appropriate for age and weight.  * for fevers greater than 101 orally you may alternate ibuprofen and tylenol every  3 hours. -Throat lozenges if help -New toothbrush in 3 days   Akeyla Molden, FNP  

## 2015-02-04 LAB — CULTURE, GROUP A STREP: STREP A CULTURE: NEGATIVE

## 2015-02-23 ENCOUNTER — Other Ambulatory Visit: Payer: Self-pay | Admitting: Obstetrics and Gynecology

## 2015-02-23 DIAGNOSIS — Z1231 Encounter for screening mammogram for malignant neoplasm of breast: Secondary | ICD-10-CM

## 2015-04-02 ENCOUNTER — Ambulatory Visit (HOSPITAL_COMMUNITY)
Admission: RE | Admit: 2015-04-02 | Discharge: 2015-04-02 | Disposition: A | Discharge status: Home / Self Care | Payer: BLUE CROSS/BLUE SHIELD | Source: Ambulatory Visit | Attending: Obstetrics and Gynecology | Admitting: Obstetrics and Gynecology

## 2015-04-02 DIAGNOSIS — Z1231 Encounter for screening mammogram for malignant neoplasm of breast: Secondary | ICD-10-CM | POA: Insufficient documentation

## 2015-09-17 ENCOUNTER — Other Ambulatory Visit (HOSPITAL_COMMUNITY)
Admission: RE | Admit: 2015-09-17 | Discharge: 2015-09-17 | Disposition: A | Discharge status: Home / Self Care | Payer: BLUE CROSS/BLUE SHIELD | Source: Ambulatory Visit | Attending: Obstetrics and Gynecology | Admitting: Obstetrics and Gynecology

## 2015-09-17 ENCOUNTER — Ambulatory Visit (INDEPENDENT_AMBULATORY_CARE_PROVIDER_SITE_OTHER): Payer: BLUE CROSS/BLUE SHIELD | Admitting: Obstetrics and Gynecology

## 2015-09-17 ENCOUNTER — Encounter: Payer: Self-pay | Admitting: Obstetrics and Gynecology

## 2015-09-17 VITALS — BP 140/84 | Ht 60.0 in | Wt 158.5 lb

## 2015-09-17 DIAGNOSIS — Z1151 Encounter for screening for human papillomavirus (HPV): Secondary | ICD-10-CM | POA: Diagnosis not present

## 2015-09-17 DIAGNOSIS — Z01411 Encounter for gynecological examination (general) (routine) with abnormal findings: Secondary | ICD-10-CM | POA: Diagnosis present

## 2015-09-17 DIAGNOSIS — Z01419 Encounter for gynecological examination (general) (routine) without abnormal findings: Secondary | ICD-10-CM

## 2015-09-17 NOTE — Progress Notes (Signed)
Patient ID: Roberta Barrett, female   DOB: February 07, 1956, 60 y.o.   MRN: [id number]  Assessment:  Annual Gyn Exam   Plan:  1. pap smear done, next pap due in 1 year (per pts request)  2. return annually or prn 3    Annual mammogram and regular self exams advised 4. CBC, CMP, TSH and lipid panel ordered today   Subjective:  Roberta Barrett is a 60 y.o. female G0P0000 who presents for annual exam. No LMP recorded. Patient is postmenopausal.   The patient has no complaints today. She is requesting yearly lab work. She states she is occasionally sexually active. Pt is not currently taking hormone therapy.   The following portions of the patient's history were reviewed and updated as appropriate: allergies, current medications, past family history, past medical history, past social history, past surgical history and problem list.  Past Medical History:  Diagnosis Date  . Dysplasia of cervix, unspecified 80's  . Shingles 12/08/2014   Past Surgical History:  Procedure Laterality Date  . COLONOSCOPY N/A 09/15/2012   Procedure: COLONOSCOPY;  Surgeon: Malissa HippoNajeeb U Rehman, MD;  [facility];  Service: Endoscopy;  Laterality: N/A;  915  . LASER ABLATION OF THE CERVIX  1985  . laser conization      Current Outpatient Prescriptions:  .  cholecalciferol (VITAMIN D) 1000 UNITS tablet, Take 2,000 Units by mouth daily., Disp: , Rfl:  .  CRANBERRY EXTRACT PO, Take 1 tablet by mouth daily., Disp: , Rfl:  .  fluticasone (FLONASE) 50 MCG/ACT nasal spray, Place 2 sprays into both nostrils daily., Disp: 16 g, Rfl: 6 .  Omega 3-6-9 Fatty Acids (OMEGA 3-6-9 COMPLEX PO), Take 1 capsule by mouth daily., Disp: , Rfl:  .  EPINEPHrine 0.3 mg/0.3 mL IJ SOAJ injection, Inject 0.3 mLs (0.3 mg total) into the muscle once. (Patient not taking: Reported on 09/17/2015), Disp: 1 Device, Rfl: 1  Review of Systems Constitutional: negative Gastrointestinal: negative Genitourinary: negative   Objective:  BP  140/84   Ht 5' (1.524 m)   Wt 158 lb 8 oz (71.9 kg)   BMI 30.95 kg/m    BMI: Body mass index is 30.95 kg/m.  General Appearance: Alert, appropriate appearance for age. No acute distress HEENT: Grossly normal Neck / Thyroid:  Cardiovascular: RRR; normal S1, S2, no murmur Lungs: CTA bilaterally Back: No CVAT Breast Exam: No masses or nodes.No dimpling, nipple retraction or discharge. Gastrointestinal: Soft, non-tender, no masses or organomegaly Pelvic Exam:  External genitalia: normal general appearance Vaginal: normal mucosa without prolapse or lesions Cervix: normal appearance Adnexa: normal bimanual exam Uterus: normal single, nontender Rectovaginal: normal rectal, no masses and guaiac negative stool obtained Lymphatic Exam: Non-palpable nodes in neck, clavicular, axillary, or inguinal regions  Skin: no rash or abnormalities Neurologic: Normal gait and speech, no tremor  Psychiatric: Alert and oriented, appropriate affect.  Urinalysis:Not done  Guaiac negative   Roberta BachJohn Peytin Barrett. MD Pgr [phone] 9:50 AM    [name], I, [name], attest that this documentation has been prepared under the direction and in the presence of [name], MD. Electronically Signed: [name], ED Scribe. 09/17/15. 9:50 AM.  I personally performed the services described in this documentation, which was SCRIBED in my presence. The recorded information has been reviewed and considered accurate. It has been edited as necessary during review. [name],[name], MD

## 2015-09-18 LAB — CYTOLOGY - PAP

## 2015-09-18 LAB — CBC
HEMOGLOBIN: 13 g/dL (ref 11.1–15.9)
Hematocrit: 40.7 % (ref 34.0–46.6)
MCH: 26.1 pg — AB (ref 26.6–33.0)
MCHC: 31.9 g/dL (ref 31.5–35.7)
MCV: 82 fL (ref 79–97)
PLATELETS: 160 10*3/uL (ref 150–379)
RBC: 4.99 x10E6/uL (ref 3.77–5.28)
RDW: 16 % — AB (ref 12.3–15.4)
WBC: 4.3 10*3/uL (ref 3.4–10.8)

## 2015-09-18 LAB — COMPREHENSIVE METABOLIC PANEL
ALBUMIN: 4.5 g/dL (ref 3.5–5.5)
ALK PHOS: 88 IU/L (ref 39–117)
ALT: 19 IU/L (ref 0–32)
AST: 22 IU/L (ref 0–40)
Albumin/Globulin Ratio: 1.6 (ref 1.2–2.2)
BILIRUBIN TOTAL: 0.7 mg/dL (ref 0.0–1.2)
BUN / CREAT RATIO: 19 (ref 9–23)
BUN: 15 mg/dL (ref 6–24)
CHLORIDE: 100 mmol/L (ref 96–106)
CO2: 24 mmol/L (ref 18–29)
Calcium: 9.6 mg/dL (ref 8.7–10.2)
Creatinine, Ser: 0.79 mg/dL (ref 0.57–1.00)
GFR calc Af Amer: 95 mL/min/{1.73_m2} (ref >59)
GFR calc non Af Amer: 82 mL/min/{1.73_m2} (ref >59)
GLUCOSE: 84 mg/dL (ref 65–99)
Globulin, Total: 2.9 g/dL (ref 1.5–4.5)
Potassium: 4.5 mmol/L (ref 3.5–5.2)
Sodium: 140 mmol/L (ref 134–144)
Total Protein: 7.4 g/dL (ref 6.0–8.5)

## 2015-09-18 LAB — LIPID PANEL
Chol/HDL Ratio: 3.9 ratio units (ref 0.0–4.4)
Cholesterol, Total: 207 mg/dL — ABNORMAL HIGH (ref 100–199)
HDL: 53 mg/dL (ref >39)
LDL Calculated: 123 mg/dL — ABNORMAL HIGH (ref 0–99)
Triglycerides: 154 mg/dL — ABNORMAL HIGH (ref 0–149)
VLDL Cholesterol Cal: 31 mg/dL (ref 5–40)

## 2015-09-18 LAB — TSH: TSH: 2.29 u[IU]/mL (ref 0.450–4.500)

## 2015-10-12 ENCOUNTER — Telehealth: Payer: Self-pay | Admitting: Family Medicine

## 2015-10-16 MED ORDER — EPINEPHRINE 0.3 MG/0.3ML IJ SOAJ: 0.3000 mg | Freq: Once | INTRAMUSCULAR | 0 refills | Status: DC

## 2015-10-16 NOTE — Telephone Encounter (Signed)
Refilled epi pen.  I am listed as PCP but have never seen pt and she does not have an appt.   Murtis SinkSam Ritika Hellickson, MD [facility] 10/16/2015, 5:34 PM

## 2015-10-17 NOTE — Telephone Encounter (Signed)
Left detailed message rx request has been refilled.

## 2016-02-05 ENCOUNTER — Other Ambulatory Visit: Payer: Self-pay | Admitting: Family

## 2016-02-05 DIAGNOSIS — J069 Acute upper respiratory infection, unspecified: Secondary | ICD-10-CM

## 2016-02-07 ENCOUNTER — Other Ambulatory Visit: Payer: Self-pay | Admitting: Obstetrics and Gynecology

## 2016-02-07 DIAGNOSIS — Z1231 Encounter for screening mammogram for malignant neoplasm of breast: Secondary | ICD-10-CM

## 2016-04-02 ENCOUNTER — Ambulatory Visit (HOSPITAL_COMMUNITY)
Admission: RE | Admit: 2016-04-02 | Discharge: 2016-04-02 | Disposition: A | Discharge status: Home / Self Care | Payer: BLUE CROSS/BLUE SHIELD | Source: Ambulatory Visit | Attending: Obstetrics and Gynecology | Admitting: Obstetrics and Gynecology

## 2016-04-02 ENCOUNTER — Ambulatory Visit (HOSPITAL_COMMUNITY): Payer: BLUE CROSS/BLUE SHIELD

## 2016-04-02 DIAGNOSIS — Z1231 Encounter for screening mammogram for malignant neoplasm of breast: Secondary | ICD-10-CM

## 2016-09-18 ENCOUNTER — Ambulatory Visit (INDEPENDENT_AMBULATORY_CARE_PROVIDER_SITE_OTHER): Payer: Commercial Managed Care - PPO | Admitting: Adult Health

## 2016-09-18 ENCOUNTER — Other Ambulatory Visit (HOSPITAL_COMMUNITY)
Admission: RE | Admit: 2016-09-18 | Discharge: 2016-09-18 | Disposition: A | Discharge status: Home / Self Care | Payer: Commercial Managed Care - PPO | Source: Ambulatory Visit | Attending: Adult Health | Admitting: Adult Health

## 2016-09-18 ENCOUNTER — Encounter: Payer: Self-pay | Admitting: Adult Health

## 2016-09-18 VITALS — BP 140/80 | HR 78 | Ht 60.0 in | Wt 159.0 lb

## 2016-09-18 DIAGNOSIS — E7800 Pure hypercholesterolemia, unspecified: Secondary | ICD-10-CM | POA: Diagnosis not present

## 2016-09-18 DIAGNOSIS — Z01411 Encounter for gynecological examination (general) (routine) with abnormal findings: Secondary | ICD-10-CM

## 2016-09-18 DIAGNOSIS — Z1212 Encounter for screening for malignant neoplasm of rectum: Secondary | ICD-10-CM | POA: Diagnosis not present

## 2016-09-18 DIAGNOSIS — Z01419 Encounter for gynecological examination (general) (routine) without abnormal findings: Secondary | ICD-10-CM | POA: Diagnosis not present

## 2016-09-18 DIAGNOSIS — N950 Postmenopausal bleeding: Secondary | ICD-10-CM

## 2016-09-18 DIAGNOSIS — Z1211 Encounter for screening for malignant neoplasm of colon: Secondary | ICD-10-CM | POA: Diagnosis present

## 2016-09-18 LAB — HEMOCCULT GUIAC POC 1CARD (OFFICE): FECAL OCCULT BLD: NEGATIVE

## 2016-09-18 NOTE — Progress Notes (Signed)
Patient ID: Roberta Barrett, female   DOB: Nov 25, 1955, 61 y.o.   MRN: [id number] History of Present Illness: Roberta Barrett is a [age] white female, married, G0P0, in for a well woman gyn exam and pap.She is PM but wiped blood about 2 weeks ago. [facility].    Current Medications, Allergies, Past Medical History, Past Surgical History, Family History and Social History were reviewed in [facility] electronic medical record.     Review of Systems: Patient denies any headaches, hearing loss, fatigue, blurred vision, shortness of breath, chest pain, abdominal pain, problems with bowel movements, urination, or intercourse(not having often husband has diabetes. No joint pain or mood swings. Wiped blood about 2 weeks ago.   Physical Exam:BP 140/80 (BP Location: Left Arm, Patient Position: Sitting, Cuff Size: Small)   Pulse 78   Ht 5' (1.524 m)   Wt 159 lb (72.1 kg)   BMI 31.05 kg/m  General:  Well developed, well nourished, no acute distress Skin:  Warm and dry Neck:  Midline trachea, normal thyroid, good ROM, no lymphadenopathy,no carotid bruits heard Lungs; Clear to auscultation bilaterally Breast:  No dominant palpable mass, retraction, or nipple discharge Cardiovascular: Regular rate and rhythm Abdomen:  Soft, non tender, no hepatosplenomegaly Pelvic:  External genitalia is normal in appearance, no lesions.  The vagina is pale with loss of rugae, has brown discharge, without odor. Urethra has no lesions or masses. The cervix is atrophic, pap with HPV performed.  Uterus is felt to be normal size, shape, and contour.  No adnexal masses or tenderness noted.Bladder is non tender, no masses felt.Used small Pederson speculum.  Rectal: Good sphincter tone, no polyps, or hemorrhoids felt.  Hemoccult negative. Extremities/musculoskeletal:  No swelling or varicosities noted, no clubbing or cyanosis Psych:  No mood changes, alert and cooperative,seems happy PHQ 2 score 0.Will get US to  assess uterus and ovaries, and discussed with her if endometrial thickened will need endo biopsy.  Impression: 1. Encounter for routine gynecological examination with Papanicolaou smear of cervix   2. Screening for colorectal cancer   3. PMB (postmenopausal bleeding)   4. Elevated cholesterol       Plan: Review handout on PMB Check CBC,CMP,TSH and lipids Return in 1 week for GYN US Physical in 1 year, pap in 3 if normal Mammogram yearly Colonoscopy per GI

## 2016-09-18 NOTE — Addendum Note (Signed)
Addended by: Federico FlakeNES, PEGGY A on: 09/18/2016 10:21 AM   Modules accepted: Orders

## 2016-09-18 NOTE — Patient Instructions (Signed)
Postmenopausal Bleeding Postmenopausal bleeding is any bleeding a woman has after she has entered into menopause. Menopause is the end of a woman's fertile years. After menopause, a woman no longer ovulates or has menstrual periods. Postmenopausal bleeding can be caused by various things. Any type of postmenopausal bleeding, even if it appears to be a typical menstrual period, is concerning. This should be evaluated by your health care provider. Any treatment will depend on the cause of the bleeding. Follow these instructions at home: Monitor your condition for any changes. The following actions may help to alleviate any discomfort you are experiencing:  Avoid the use of tampons and douches as directed by your health care provider.  Change your pads frequently.  Get regular pelvic exams and Pap tests.  Keep all follow-up appointments for diagnostic tests as directed by your health care provider.  Contact a health care provider if:  Your bleeding lasts more than 1 week.  You have abdominal pain.  You have bleeding with sexual intercourse. Get help right away if:  You have a fever, chills, headache, dizziness, muscle aches, and bleeding.  You have severe pain with bleeding.  You are passing blood clots.  You have bleeding and need more than 1 pad an hour.  You feel faint. This information is not intended to replace advice given to you by your health care provider. Make sure you discuss any questions you have with your health care provider. Document Released: 05/07/2005 Document Revised: 07/05/2015 Document Reviewed: 08/26/2012 Elsevier Interactive Patient Education  2018 [employer] [place] in 1 week

## 2016-09-19 ENCOUNTER — Encounter: Payer: Self-pay | Admitting: Physician Assistant

## 2016-09-19 ENCOUNTER — Telehealth: Payer: Self-pay | Admitting: Adult Health

## 2016-09-19 ENCOUNTER — Ambulatory Visit (INDEPENDENT_AMBULATORY_CARE_PROVIDER_SITE_OTHER): Payer: Commercial Managed Care - PPO | Admitting: Physician Assistant

## 2016-09-19 VITALS — BP 148/87 | HR 84 | Temp 98.0°F | Ht 60.0 in | Wt 156.4 lb

## 2016-09-19 DIAGNOSIS — R490 Dysphonia: Secondary | ICD-10-CM

## 2016-09-19 DIAGNOSIS — J301 Allergic rhinitis due to pollen: Secondary | ICD-10-CM

## 2016-09-19 LAB — CBC
HEMATOCRIT: 36.3 % (ref 34.0–46.6)
Hemoglobin: 11.8 g/dL (ref 11.1–15.9)
MCH: 27.7 pg (ref 26.6–33.0)
MCHC: 32.5 g/dL (ref 31.5–35.7)
MCV: 85 fL (ref 79–97)
Platelets: 160 10*3/uL (ref 150–379)
RBC: 4.26 x10E6/uL (ref 3.77–5.28)
RDW: 14.2 % (ref 12.3–15.4)
WBC: 4.3 10*3/uL (ref 3.4–10.8)

## 2016-09-19 LAB — COMPREHENSIVE METABOLIC PANEL
ALK PHOS: 84 IU/L (ref 39–117)
ALT: 10 IU/L (ref 0–32)
AST: 22 IU/L (ref 0–40)
Albumin/Globulin Ratio: 1.5 (ref 1.2–2.2)
Albumin: 4.6 g/dL (ref 3.6–4.8)
BUN/Creatinine Ratio: 14 (ref 12–28)
BUN: 14 mg/dL (ref 8–27)
Bilirubin Total: 0.6 mg/dL (ref 0.0–1.2)
CALCIUM: 9.5 mg/dL (ref 8.7–10.3)
CO2: 23 mmol/L (ref 20–29)
CREATININE: 0.97 mg/dL (ref 0.57–1.00)
Chloride: 103 mmol/L (ref 96–106)
GFR calc Af Amer: 73 mL/min/{1.73_m2} (ref >59)
GFR, EST NON AFRICAN AMERICAN: 64 mL/min/{1.73_m2} (ref >59)
GLOBULIN, TOTAL: 3 g/dL (ref 1.5–4.5)
GLUCOSE: 95 mg/dL (ref 65–99)
Potassium: 4.2 mmol/L (ref 3.5–5.2)
SODIUM: 142 mmol/L (ref 134–144)
Total Protein: 7.6 g/dL (ref 6.0–8.5)

## 2016-09-19 LAB — LIPID PANEL
CHOL/HDL RATIO: 3.9 ratio (ref 0.0–4.4)
CHOLESTEROL TOTAL: 201 mg/dL — AB (ref 100–199)
HDL: 52 mg/dL (ref >39)
LDL Calculated: 106 mg/dL — ABNORMAL HIGH (ref 0–99)
TRIGLYCERIDES: 215 mg/dL — AB (ref 0–149)
VLDL Cholesterol Cal: 43 mg/dL — ABNORMAL HIGH (ref 5–40)

## 2016-09-19 LAB — TSH: TSH: 1.8 u[IU]/mL (ref 0.450–4.500)

## 2016-09-19 MED ORDER — MONTELUKAST SODIUM 10 MG PO TABS: 10.0000 mg | ORAL_TABLET | Freq: Every day | ORAL | 5 refills | Status: DC

## 2016-09-19 NOTE — Progress Notes (Signed)
BP (!) 148/87   Pulse 84   Temp 98 F (36.7 C) (Oral)   Ht 5' (1.524 m)   Wt 156 lb 6.4 oz (70.9 kg)   BMI 30.54 kg/m    Subjective:    Patient ID: Roberta Barrett, female    DOB: January 18, 1956, 61 y.o.   MRN: [id number]  HPI: Roberta Barrett is a 61 y.o. female presenting on 09/19/2016 for Hoarse  This patient has had hoarseness and voice change or greater than 6 weeks now. She knows she has significant seasonal allergies. They were quite severe this spring with the heavy pollen. She will have change of voice. She does not experience a sore throat. She does have postnasal drainage. She does have some swelling in the nose. She has not seen any blood or discoloration of the congestion. When she tried Zyrtec it made her too dry. She had used in the past. She tried Flonase next and had increased hoarseness.  Relevant past medical, surgical, family and social history reviewed and updated as indicated. Allergies and medications reviewed and updated.  Past Medical History:  Diagnosis Date  . Dysplasia of cervix, unspecified 80's  . Shingles 12/08/2014    Past Surgical History:  Procedure Laterality Date  . COLONOSCOPY N/A 09/15/2012   Procedure: COLONOSCOPY;  Surgeon: Malissa Hippo, MD;  [facility];  Service: Endoscopy;  Laterality: N/A;  915  . LASER ABLATION OF THE CERVIX  1985  . laser conization      Review of Systems  Constitutional: Negative.  Negative for activity change, fatigue and fever.  HENT: Positive for congestion, postnasal drip, rhinorrhea and voice change. Negative for sinus pain, sinus pressure, sore throat and trouble swallowing.   Eyes: Negative.   Respiratory: Negative.  Negative for cough.   Cardiovascular: Negative.  Negative for chest pain.  Gastrointestinal: Negative.  Negative for abdominal distention, abdominal pain, diarrhea, nausea and vomiting.  Endocrine: Negative.   Genitourinary: Negative.  Negative for dysuria.  Musculoskeletal:  Negative.   Skin: Negative.   Neurological: Negative.     Allergies as of 09/19/2016      Reactions   Bee Venom Hives, Shortness Of Breath, Swelling   Penicillins Rash   Sulfur Rash      Medication List       Accurate as of 09/19/16  2:41 PM. Always use your most recent med list.          cholecalciferol 1000 units tablet Commonly known as:  VITAMIN D Take 2,000 Units by mouth daily.   CRANBERRY EXTRACT PO Take 1 tablet by mouth daily.   fluticasone 50 MCG/ACT nasal spray Commonly known as:  FLONASE Place 2 sprays into both nostrils daily.   montelukast 10 MG tablet Commonly known as:  SINGULAIR Take 1 tablet (10 mg total) by mouth at bedtime.   OMEGA 3-6-9 COMPLEX PO Take 1 capsule by mouth daily.          Objective:    BP (!) 148/87   Pulse 84   Temp 98 F (36.7 C) (Oral)   Ht 5' (1.524 m)   Wt 156 lb 6.4 oz (70.9 kg)   BMI 30.54 kg/m   Allergies  Allergen Reactions  . Bee Venom Hives, Shortness Of Breath and Swelling  . Penicillins Rash  . Sulfur Rash    Physical Exam  Constitutional: She is oriented to person, place, and time. She appears well-developed and well-nourished.  HENT:  Head: Normocephalic and atraumatic.  Right Ear: Tympanic membrane is not injected and not retracted. A middle ear effusion is present.  Left Ear: Tympanic membrane is not injected and not retracted. A middle ear effusion is present.  Nose: Mucosal edema and rhinorrhea present.  Mouth/Throat: Posterior oropharyngeal edema and posterior oropharyngeal erythema present.  Eyes: Pupils are equal, round, and reactive to light. Conjunctivae and EOM are normal.  Cardiovascular: Normal rate, regular rhythm, normal heart sounds and intact distal pulses.   Pulmonary/Chest: Effort normal and breath sounds normal.  Abdominal: Soft. Bowel sounds are normal.  Neurological: She is alert and oriented to person, place, and time. She has normal reflexes.  Skin: Skin is warm and dry. No  rash noted.  Psychiatric: She has a normal mood and affect. Her behavior is normal. Judgment and thought content normal.       Assessment & Plan:   1. Hoarseness of voice  - Ambulatory referral to ENT  2. Non-seasonal allergic rhinitis due to pollen - Ambulatory referral to ENT    Current Outpatient Prescriptions:  .  cholecalciferol (VITAMIN D) 1000 UNITS tablet, Take 2,000 Units by mouth daily., Disp: , Rfl:  .  CRANBERRY EXTRACT PO, Take 1 tablet by mouth daily., Disp: , Rfl:  .  Omega 3-6-9 Fatty Acids (OMEGA 3-6-9 COMPLEX PO), Take 1 capsule by mouth daily., Disp: , Rfl:  .  fluticasone (FLONASE) 50 MCG/ACT nasal spray, Place 2 sprays into both nostrils daily. (Patient not taking: Reported on 09/19/2016), Disp: 16 g, Rfl: 0 .  montelukast (SINGULAIR) 10 MG tablet, Take 1 tablet (10 mg total) by mouth at bedtime., Disp: 30 tablet, Rfl: 5 Continue all other maintenance medications as listed above.  Follow up plan: Return if symptoms worsen or fail to improve.  Educational handout given for survey  Remus LofflerAngel S. Jaycee Pelzer PA-C [facility] [address]  [place], [place] [address] [phone]   09/19/2016, 2:41 PM

## 2016-09-19 NOTE — Patient Instructions (Signed)
Allergic Rhinitis Allergic rhinitis is when the mucous membranes in the nose respond to allergens. Allergens are particles in the air that cause your body to have an allergic reaction. This causes you to release allergic antibodies. Through a chain of events, these eventually cause you to release histamine into the blood stream. Although meant to protect the body, it is this release of histamine that causes your discomfort, such as frequent sneezing, congestion, and an itchy, runny nose. What are the causes? Seasonal allergic rhinitis (hay fever) is caused by pollen allergens that may come from grasses, trees, and weeds. Year-round allergic rhinitis (perennial allergic rhinitis) is caused by allergens such as house dust mites, pet dander, and mold spores. What are the signs or symptoms?  Nasal stuffiness (congestion).  Itchy, runny nose with sneezing and tearing of the eyes. How is this diagnosed? Your health care provider can help you determine the allergen or allergens that trigger your symptoms. If you and your health care provider are unable to determine the allergen, skin or blood testing may be used. Your health care provider will diagnose your condition after taking your health history and performing a physical exam. Your health care provider may assess you for other related conditions, such as asthma, pink eye, or an ear infection. How is this treated? Allergic rhinitis does not have a cure, but it can be controlled by:  Medicines that block allergy symptoms. These may include allergy shots, nasal sprays, and oral antihistamines.  Avoiding the allergen. Hay fever may often be treated with antihistamines in pill or nasal spray forms. Antihistamines block the effects of histamine. There are over-the-counter medicines that may help with nasal congestion and swelling around the eyes. Check with your health care provider before taking or giving this medicine. If avoiding the allergen or the  medicine prescribed do not work, there are many new medicines your health care provider can prescribe. Stronger medicine may be used if initial measures are ineffective. Desensitizing injections can be used if medicine and avoidance does not work. Desensitization is when a patient is given ongoing shots until the body becomes less sensitive to the allergen. Make sure you follow up with your health care provider if problems continue. Follow these instructions at home: It is not possible to completely avoid allergens, but you can reduce your symptoms by taking steps to limit your exposure to them. It helps to know exactly what you are allergic to so that you can avoid your specific triggers. Contact a health care provider if:  You have a fever.  You develop a cough that does not stop easily (persistent).  You have shortness of breath.  You start wheezing.  Symptoms interfere with normal daily activities. This information is not intended to replace advice given to you by your health care provider. Make sure you discuss any questions you have with your health care provider. Document Released: 10/22/2000 Document Revised: 09/28/2015 Document Reviewed: 10/04/2012 Elsevier Interactive Patient Education  2017 Elsevier Inc.  

## 2016-09-19 NOTE — Telephone Encounter (Signed)
Left message I called 

## 2016-09-22 ENCOUNTER — Ambulatory Visit (INDEPENDENT_AMBULATORY_CARE_PROVIDER_SITE_OTHER): Payer: Commercial Managed Care - PPO

## 2016-09-22 DIAGNOSIS — N950 Postmenopausal bleeding: Secondary | ICD-10-CM | POA: Diagnosis not present

## 2016-09-22 LAB — CYTOLOGY - PAP
DIAGNOSIS: NEGATIVE
HPV: NOT DETECTED

## 2016-09-22 NOTE — Telephone Encounter (Signed)
Pt aware that pap in negative for malignancy and HPV and labs were good and that US showed normal ovaries and ECC1.5 mm which is good and small calcification in uterus that you can keep

## 2016-09-22 NOTE — Progress Notes (Signed)
PELVIC US TA/TV: homogeneous retroverted uterus w/a 2.4 x 3 mm myometrial calcification mid/right,EEC 1.5 mm,normal ovaries bilat,right ovary appears mobile,unable to slide left ovary because of location (limited view),no free fluid,no pain during ultrasound

## 2016-09-26 ENCOUNTER — Other Ambulatory Visit: Payer: Commercial Managed Care - PPO

## 2016-10-27 ENCOUNTER — Telehealth: Payer: Self-pay | Admitting: Adult Health

## 2016-10-27 NOTE — Telephone Encounter (Signed)
Pt said Saturday had pinkish discharge and then yellowish, stomach felt funny, no itching or burning, no odor now, she is good now, if returns call and make appt.

## 2016-12-01 ENCOUNTER — Telehealth: Payer: Self-pay | Admitting: Adult Health

## 2016-12-01 NOTE — Telephone Encounter (Signed)
Patient called and left a message stating that she would like a call back from PocahontasJennifer, Patient did not state the reason why. Please contact pt

## 2016-12-01 NOTE — Telephone Encounter (Signed)
Called patient to get more information regarding her call but stated she just wanted to talk with Roberta Barrett.  Not an emergency and call can be returned tomorrow.

## 2016-12-02 NOTE — Telephone Encounter (Signed)
Roberta Barrett just letting me know that last week wiped lite pink, none since

## 2016-12-12 ENCOUNTER — Other Ambulatory Visit (HOSPITAL_COMMUNITY)
Admission: RE | Admit: 2016-12-12 | Discharge: 2016-12-12 | Disposition: A | Discharge status: Home / Self Care | Payer: Commercial Managed Care - PPO | Source: Other Acute Inpatient Hospital | Attending: Urology | Admitting: Urology

## 2016-12-12 ENCOUNTER — Ambulatory Visit (INDEPENDENT_AMBULATORY_CARE_PROVIDER_SITE_OTHER): Payer: Commercial Managed Care - PPO | Admitting: Urology

## 2016-12-12 DIAGNOSIS — R350 Frequency of micturition: Secondary | ICD-10-CM | POA: Insufficient documentation

## 2016-12-12 DIAGNOSIS — Z87440 Personal history of urinary (tract) infections: Secondary | ICD-10-CM | POA: Diagnosis not present

## 2016-12-12 DIAGNOSIS — Z87442 Personal history of urinary calculi: Secondary | ICD-10-CM | POA: Diagnosis not present

## 2016-12-12 LAB — URINALYSIS, ROUTINE W REFLEX MICROSCOPIC
BILIRUBIN URINE: NEGATIVE
Bacteria, UA: NONE SEEN
GLUCOSE, UA: NEGATIVE mg/dL
KETONES UR: NEGATIVE mg/dL
Leukocytes, UA: NEGATIVE
NITRITE: NEGATIVE
PH: 5 (ref 5.0–8.0)
Protein, ur: NEGATIVE mg/dL
Specific Gravity, Urine: 1.02 (ref 1.005–1.030)

## 2016-12-13 LAB — URINE CULTURE: Culture: NO GROWTH

## 2017-02-12 ENCOUNTER — Ambulatory Visit (INDEPENDENT_AMBULATORY_CARE_PROVIDER_SITE_OTHER): Payer: Commercial Managed Care - PPO | Admitting: *Deleted

## 2017-02-12 DIAGNOSIS — Z23 Encounter for immunization: Secondary | ICD-10-CM

## 2017-02-16 ENCOUNTER — Other Ambulatory Visit: Payer: Self-pay | Admitting: Obstetrics and Gynecology

## 2017-02-16 DIAGNOSIS — Z1231 Encounter for screening mammogram for malignant neoplasm of breast: Secondary | ICD-10-CM

## 2017-03-17 ENCOUNTER — Ambulatory Visit (INDEPENDENT_AMBULATORY_CARE_PROVIDER_SITE_OTHER): Payer: Commercial Managed Care - PPO | Admitting: Adult Health

## 2017-03-17 ENCOUNTER — Encounter: Payer: Self-pay | Admitting: Adult Health

## 2017-03-17 VITALS — BP 180/80 | HR 98 | Ht 60.0 in | Wt 163.5 lb

## 2017-03-17 DIAGNOSIS — B9689 Other specified bacterial agents as the cause of diseases classified elsewhere: Secondary | ICD-10-CM

## 2017-03-17 DIAGNOSIS — N760 Acute vaginitis: Secondary | ICD-10-CM | POA: Diagnosis not present

## 2017-03-17 DIAGNOSIS — N898 Other specified noninflammatory disorders of vagina: Secondary | ICD-10-CM

## 2017-03-17 LAB — POCT WET PREP (WET MOUNT)
CLUE CELLS WET PREP WHIFF POC: POSITIVE
WBC, Wet Prep HPF POC: POSITIVE

## 2017-03-17 MED ORDER — METRONIDAZOLE 500 MG PO TABS: 500.0000 mg | ORAL_TABLET | Freq: Two times a day (BID) | ORAL | 0 refills | Status: DC

## 2017-03-17 NOTE — Progress Notes (Signed)
Subjective:     Patient ID: Roberta LeatherwoodRobin P Barrett, female   DOB: 04/10/1955, 62 y.o.   MRN: [id number]  HPI Roberta BallRobin is a 62 year old white female in complaining of vaginal discharge with odor and some irritation.Has discharge at times but like this for about 2 weeks.  Review of Systems +vaginal discharge +vaginal odor +Vaginal irritation Reviewed past medical,surgical, social and family history. Reviewed medications and allergies.     Objective:   Physical Exam BP (!) 180/80 (BP Location: Right Arm, Patient Position: Sitting, Cuff Size: Normal)   Pulse 98   Ht 5' (1.524 m)   Wt 163 lb 8 oz (74.2 kg)   BMI 31.93 kg/m    Skin warm and dry.Pelvic: external genitalia is normal in appearance no lesions, vagina: tan to yellowish discharge with odor,urethra has no lesions or masses noted, cervix:smooth, uterus: normal size, shape and contour, non tender, no masses felt, adnexa: no masses or tenderness noted. Bladder is non tender and no masses felt. Wet prep: + for clue cells and +WBCs,parabasal cells too.  Assessment:     1. BV (bacterial vaginosis)   2. Vaginal discharge   3. Vaginal odor       Plan:     Rx flagyl 500 mg 1 bid x 7 days, no alcohol, or sex during treatment    F/U prn

## 2017-04-08 ENCOUNTER — Ambulatory Visit (HOSPITAL_COMMUNITY)
Admission: RE | Admit: 2017-04-08 | Discharge: 2017-04-08 | Disposition: A | Discharge status: Home / Self Care | Payer: Commercial Managed Care - PPO | Source: Ambulatory Visit | Attending: Obstetrics and Gynecology | Admitting: Obstetrics and Gynecology

## 2017-04-08 ENCOUNTER — Encounter (HOSPITAL_COMMUNITY): Payer: Self-pay

## 2017-04-08 DIAGNOSIS — Z1231 Encounter for screening mammogram for malignant neoplasm of breast: Secondary | ICD-10-CM | POA: Insufficient documentation

## 2017-08-03 ENCOUNTER — Telehealth: Payer: Self-pay | Admitting: Adult Health

## 2017-08-03 NOTE — Telephone Encounter (Signed)
She had red spotting Saturday x 1 she had worked out Thursday and Friday on trampoline, none now. Had US that was normal last year, but has noticed brown discharge at times.usually mid month, will make appt to check.

## 2017-08-03 NOTE — Telephone Encounter (Signed)
Spoke with pt. Pt states last year she had some spotting. She thinks it was vaginal atrophy. She jumped on trampoline Thursday and Friday for 15 minutes. She had some spotting Saturday. It just lasted Saturday. Also, she has a brown discharge every month, about the same time each month. She has noticed that for about 6 months. Lasts for a few day. What do you advise? Thanks!! JSY

## 2017-08-19 ENCOUNTER — Telehealth: Payer: Self-pay | Admitting: Adult Health

## 2017-08-19 NOTE — Telephone Encounter (Signed)
Left message to call office in am and get US scheduled to assess for for PMB, and keep appt in August

## 2017-08-31 ENCOUNTER — Other Ambulatory Visit: Payer: Self-pay | Admitting: Adult Health

## 2017-08-31 DIAGNOSIS — N950 Postmenopausal bleeding: Secondary | ICD-10-CM

## 2017-09-01 ENCOUNTER — Ambulatory Visit (INDEPENDENT_AMBULATORY_CARE_PROVIDER_SITE_OTHER): Payer: Commercial Managed Care - PPO

## 2017-09-01 DIAGNOSIS — N950 Postmenopausal bleeding: Secondary | ICD-10-CM | POA: Diagnosis not present

## 2017-09-01 DIAGNOSIS — N888 Other specified noninflammatory disorders of cervix uteri: Secondary | ICD-10-CM | POA: Diagnosis not present

## 2017-09-01 NOTE — Progress Notes (Addendum)
PELVIC US TA/TV: homogeneous retroverted uterus w/ a 2.4 mm myometrial calcification mid/right n/c,simple nabothian cyst .9 x .5 x .9 cm,normal ovaries bilat,ovaries appear mobile,no free fluid,no pain during ultrasound

## 2017-09-02 ENCOUNTER — Telehealth: Payer: Self-pay | Admitting: Adult Health

## 2017-09-02 NOTE — Telephone Encounter (Signed)
Pt aware US normal EEC 1.703mm, normal ovaries

## 2017-09-21 ENCOUNTER — Other Ambulatory Visit (HOSPITAL_COMMUNITY)
Admission: RE | Admit: 2017-09-21 | Discharge: 2017-09-21 | Disposition: A | Discharge status: Home / Self Care | Payer: Commercial Managed Care - PPO | Source: Ambulatory Visit | Attending: Adult Health | Admitting: Adult Health

## 2017-09-21 ENCOUNTER — Encounter: Payer: Self-pay | Admitting: Adult Health

## 2017-09-21 ENCOUNTER — Ambulatory Visit (INDEPENDENT_AMBULATORY_CARE_PROVIDER_SITE_OTHER): Payer: Commercial Managed Care - PPO | Admitting: Adult Health

## 2017-09-21 VITALS — BP 141/82 | HR 83 | Ht 59.25 in | Wt 162.5 lb

## 2017-09-21 DIAGNOSIS — E7800 Pure hypercholesterolemia, unspecified: Secondary | ICD-10-CM

## 2017-09-21 DIAGNOSIS — Z1212 Encounter for screening for malignant neoplasm of rectum: Secondary | ICD-10-CM | POA: Diagnosis not present

## 2017-09-21 DIAGNOSIS — Z1211 Encounter for screening for malignant neoplasm of colon: Secondary | ICD-10-CM | POA: Diagnosis not present

## 2017-09-21 DIAGNOSIS — Z01419 Encounter for gynecological examination (general) (routine) without abnormal findings: Secondary | ICD-10-CM

## 2017-09-21 LAB — HEMOCCULT GUIAC POC 1CARD (OFFICE): FECAL OCCULT BLD: NEGATIVE

## 2017-09-21 NOTE — Progress Notes (Signed)
Patient ID: Roberta LeatherwoodRobin P Barrett, female   DOB: 1955/09/04, 62 y.o.   MRN: [id number] History of Present Illness: Roberta BallRobin is a 62 year old white female,married, PM in for well woman gyn exam and pap and she wants fasting labs. [facility]CP [facility]   Current Medications, Allergies, Past Medical History, Past Surgical History, Family History and Social History were reviewed in [facility] electronic medical record.     Review of Systems: Patient denies any headaches, hearing loss, fatigue, blurred vision, shortness of breath, chest pain, abdominal pain, problems with bowel movements, urination, or intercourse. No joint pain or mood swings.    Physical Exam:BP (!) 141/82 (BP Location: Right Arm, Patient Position: Sitting, Cuff Size: Normal)   Pulse 83   Ht 4' 11.25" (1.505 m)   Wt 162 lb 8 oz (73.7 kg)   BMI 32.54 kg/m  General:  Well developed, well nourished, no acute distress Skin:  Warm and dry Neck:  Midline trachea, normal thyroid, good ROM, no lymphadenopathy,no carotid bruits heard Lungs; Clear to auscultation bilaterally Breast:  No dominant palpable mass, retraction, or nipple discharge Cardiovascular: Regular rate and rhythm Abdomen:  Soft, non tender, no hepatosplenomegaly Pelvic:  External genitalia is normal in appearance, no lesions.  The vagina is normal in appearance. Urethra has no lesions or masses. The cervix is nulliparous, pap with HPV performed at her request.  Uterus is felt to be normal size, shape, and contour.  No adnexal masses or tenderness noted.Bladder is non tender, no masses felt. Rectal: Good sphincter tone, no polyps, or hemorrhoids felt.  Hemoccult negative. Extremities/musculoskeletal:  No swelling or varicosities noted, no clubbing or cyanosis Psych:  No mood changes, alert and cooperative,seems happy PHQ 2 score 0.  Impression: 1. Encounter for routine gynecological examination with Papanicolaou smear of cervix   2. Screening for colorectal  cancer       Plan: Check CBC,CMP,TSH and lipids Physical in 1 year Pap in 3 if normal Mammogram yearly Colonoscopy per GI

## 2017-09-22 ENCOUNTER — Telehealth: Payer: Self-pay | Admitting: Adult Health

## 2017-09-22 LAB — LIPID PANEL
CHOLESTEROL TOTAL: 191 mg/dL (ref 100–199)
Chol/HDL Ratio: 3.5 ratio (ref 0.0–4.4)
HDL: 54 mg/dL (ref >39)
LDL Calculated: 125 mg/dL — ABNORMAL HIGH (ref 0–99)
TRIGLYCERIDES: 59 mg/dL (ref 0–149)
VLDL CHOLESTEROL CAL: 12 mg/dL (ref 5–40)

## 2017-09-22 LAB — CBC
HEMOGLOBIN: 11 g/dL — AB (ref 11.1–15.9)
Hematocrit: 33.3 % — ABNORMAL LOW (ref 34.0–46.6)
MCH: 26.7 pg (ref 26.6–33.0)
MCHC: 33 g/dL (ref 31.5–35.7)
MCV: 81 fL (ref 79–97)
PLATELETS: 178 10*3/uL (ref 150–450)
RBC: 4.12 x10E6/uL (ref 3.77–5.28)
RDW: 16.5 % — ABNORMAL HIGH (ref 12.3–15.4)
WBC: 4 10*3/uL (ref 3.4–10.8)

## 2017-09-22 LAB — COMPREHENSIVE METABOLIC PANEL
ALBUMIN: 4.2 g/dL (ref 3.6–4.8)
ALK PHOS: 91 IU/L (ref 39–117)
ALT: 14 IU/L (ref 0–32)
AST: 21 IU/L (ref 0–40)
Albumin/Globulin Ratio: 1.6 (ref 1.2–2.2)
BILIRUBIN TOTAL: 0.6 mg/dL (ref 0.0–1.2)
BUN / CREAT RATIO: 15 (ref 12–28)
BUN: 13 mg/dL (ref 8–27)
CHLORIDE: 105 mmol/L (ref 96–106)
CO2: 22 mmol/L (ref 20–29)
Calcium: 8.7 mg/dL (ref 8.7–10.3)
Creatinine, Ser: 0.88 mg/dL (ref 0.57–1.00)
GFR calc Af Amer: 82 mL/min/{1.73_m2} (ref >59)
GFR calc non Af Amer: 71 mL/min/{1.73_m2} (ref >59)
GLUCOSE: 95 mg/dL (ref 65–99)
Globulin, Total: 2.7 g/dL (ref 1.5–4.5)
Potassium: 4.5 mmol/L (ref 3.5–5.2)
Sodium: 140 mmol/L (ref 134–144)
Total Protein: 6.9 g/dL (ref 6.0–8.5)

## 2017-09-22 LAB — TSH: TSH: 1.94 u[IU]/mL (ref 0.450–4.500)

## 2017-09-22 NOTE — Telephone Encounter (Signed)
Left message to call me.

## 2017-09-22 NOTE — Telephone Encounter (Signed)
pt aware of labs, take centrum silver and eat a steak occasionally, but cholesterol looks good

## 2017-09-23 ENCOUNTER — Ambulatory Visit (INDEPENDENT_AMBULATORY_CARE_PROVIDER_SITE_OTHER): Payer: Commercial Managed Care - PPO | Admitting: *Deleted

## 2017-09-23 DIAGNOSIS — Z23 Encounter for immunization: Secondary | ICD-10-CM

## 2017-09-23 LAB — CYTOLOGY - PAP
Diagnosis: NEGATIVE
HPV (WINDOPATH): NOT DETECTED

## 2017-09-23 NOTE — Patient Instructions (Signed)
Live Zoster (Shingles) Vaccine, ZVL: What You Need to Know  1. What is shingles?  Shingles (also called herpes zoster, or just zoster) is a painful skin rash, often with blisters. Shingles is caused by the varicella zoster virus, the same virus that causes chickenpox. After you have chickenpox, the virus stays in your body and can cause shingles later in life.  You can't catch shingles from another person. However, a person who has never had chickenpox (or chickenpox vaccine) could get chickenpox from someone with shingles.  A shingles rash usually appears on one side of the face or body and heals within 2 to 4 weeks. Its main symptom is pain, which can be severe. Other symptoms can include fever, headache, chills, and upset stomach. Very rarely, a shingles infection can lead to pneumonia, hearing problems, blindness, brain inflammation (encephalitis), or death.  For about 1 person in 5, severe pain can continue even long after the rash has cleared up. This long-lasting pain is called post-herpetic neuralgia (PHN).  Shingles is far more common in people 50 years of age and older than in younger people, and the risk increases with age. It is also more common in people whose immune system is weakened because of a disease such as cancer or by drugs such as steroids or chemotherapy.  At least 1 million people a year in the [place] get shingles.  2. Shingles vaccine (live)  A live shingles vaccine was approved by FDA in 2006. In a clinical trial, the vaccine reduced the risk of shingles by about 50% in people 60 and older. It can reduce the likelihood of PHN, and reduce pain in some people who still get shingles after being vaccinated.  The recommended schedule for live shingles vaccine is a single dose for adults 60 years of age and older.  3. Some people should not get this vaccine  Tell your vaccine provider if you:  · Have any severe, life-threatening allergies. A person who has ever had a life-threatening  allergic reaction after a dose of live shingles vaccine, or has a severe allergy to any component of this vaccine, may be advised not to be vaccinated. Ask your health care provider if you want information about vaccine components.  · Are pregnant, or think you might be pregnant. Pregnant women should wait to get live shingles vaccine until they are no longer pregnant. Women should avoid getting pregnant for at least 1 month after getting shingles vaccine.  · Have a weakened immune system due to disease (such as cancer or AIDS) or medical treatments (such as radiation, immunotherapy, high-dose steroids, or chemotherapy).  · Are not feeling well. If you have a mild illness, such as a cold, you can probably get the vaccine today. If you are moderately or severely ill, you should probably wait until you recover. Your doctor can advise you.    4. Risks of a vaccine reaction  With any medicine, including vaccines, there is a chance of reactions.  After live shingles vaccination, a person might experience:  · Redness, soreness, swelling, or itching at the site of the injection  · Headache    These events are usually mild and go away on their own.  Rarely, live shingles vaccine can cause rash or shingles.  Other things that could happen after this vaccine:  · People sometimes faint after medical procedures, including vaccination. Sitting or lying down for about 15 minutes can help prevent fainting and injuries caused by a fall. Tell your   provider if you feel dizzy or have vision changes or ringing in the ears.  · Some people get shoulder pain that can be more severe and longer-lasting than routine soreness that can follow injections. This happens very rarely.  · Any medication can cause a severe allergic reaction. Such reactions to a vaccine are estimated at about 1 in a million doses, and would happen within a few minutes to a few hours after the vaccination.  As with any medicine, there is a very remote chance of a  vaccine causing a serious injury or death.  The safety of vaccines is always being monitored. For more information, visit: www.cdc.gov/vaccinesafety/  5. What if there is a serious problem?  What should I look for?  · Look for anything that concerns you, such as signs of a severe allergic reaction, very high fever, or unusual behavior.  Signs of a severe allergic reaction can include hives, swelling of the face and throat, difficulty breathing, a fast heartbeat, dizziness, and weakness. These would usually start a few minutes to a few hours after the vaccination.  What should I do?  · If you think it is a severe allergic reaction or other emergency that can't wait, call 9-1-1 and get to the nearest hospital. Otherwise, call your health care provider.  · Afterward, the reaction should be reported to the Vaccine Adverse Event Reporting System (VAERS). Your doctor should file this report, or you can do it yourself through the VAERS web site at www.vaers.hhs.gov or by calling 1-800-822-7967.  VAERS does not give medical advice.  6. How can I learn more?  · Ask your healthcare provider. He or she can give you the vaccine package insert or suggest other sources of information.  · Call your local or state health department.  · Contact the Centers for Disease Control and Prevention (CDC):  ? Call 1-800-232-4636(1-800-CDC-INFO) or  ? Visit CDC's website at www.cdc.gov/vaccines  CDC Vaccine Information Statement (VIS) Live Zoster Vaccine (03/24/2016)  This information is not intended to replace advice given to you by your health care provider. Make sure you discuss any questions you have with your health care provider.  Document Released: 11/24/2005 Document Revised: 04/08/2016 Document Reviewed: 04/08/2016  Elsevier Interactive Patient Education © 2018 Elsevier Inc.

## 2017-09-23 NOTE — Progress Notes (Signed)
Pt given Shingrix vaccine Tolerated well 

## 2017-09-30 ENCOUNTER — Telehealth: Payer: Self-pay | Admitting: *Deleted

## 2017-09-30 NOTE — Telephone Encounter (Signed)
Noted that HGB 14 per pt when gave blood on 8/6 and on 8/12 it was 11.0, take centrum silver

## 2017-10-21 ENCOUNTER — Other Ambulatory Visit: Payer: Self-pay | Admitting: Family Medicine

## 2017-10-22 MED ORDER — EPINEPHRINE 0.3 MG/0.3ML IJ SOAJ: 2.0000 mg | Freq: Once | INTRAMUSCULAR | 2 refills | Status: DC

## 2017-10-22 NOTE — Telephone Encounter (Signed)
Patient is still waiting.

## 2017-10-22 NOTE — Telephone Encounter (Signed)
rx sent to pharmacy

## 2017-10-22 NOTE — Telephone Encounter (Signed)
Pt aware.

## 2017-12-01 ENCOUNTER — Ambulatory Visit (INDEPENDENT_AMBULATORY_CARE_PROVIDER_SITE_OTHER): Payer: Commercial Managed Care - PPO | Admitting: *Deleted

## 2017-12-01 DIAGNOSIS — Z23 Encounter for immunization: Secondary | ICD-10-CM | POA: Diagnosis not present

## 2017-12-01 NOTE — Progress Notes (Signed)
Pt given 2nd Shingrix vaccine Tolerated well 

## 2017-12-18 ENCOUNTER — Ambulatory Visit (INDEPENDENT_AMBULATORY_CARE_PROVIDER_SITE_OTHER): Payer: Commercial Managed Care - PPO | Admitting: Urology

## 2017-12-18 ENCOUNTER — Other Ambulatory Visit (HOSPITAL_COMMUNITY)
Admission: RE | Admit: 2017-12-18 | Discharge: 2017-12-18 | Disposition: A | Discharge status: Home / Self Care | Payer: Commercial Managed Care - PPO | Source: Other Acute Inpatient Hospital | Attending: Urology | Admitting: Urology

## 2017-12-18 DIAGNOSIS — R3121 Asymptomatic microscopic hematuria: Secondary | ICD-10-CM | POA: Insufficient documentation

## 2017-12-18 DIAGNOSIS — Z87442 Personal history of urinary calculi: Secondary | ICD-10-CM | POA: Diagnosis not present

## 2017-12-18 DIAGNOSIS — N3941 Urge incontinence: Secondary | ICD-10-CM

## 2017-12-18 DIAGNOSIS — Z87440 Personal history of urinary (tract) infections: Secondary | ICD-10-CM | POA: Diagnosis not present

## 2017-12-18 LAB — URINALYSIS, COMPLETE (UACMP) WITH MICROSCOPIC
BILIRUBIN URINE: NEGATIVE
Glucose, UA: NEGATIVE mg/dL
KETONES UR: NEGATIVE mg/dL
LEUKOCYTES UA: NEGATIVE
Nitrite: NEGATIVE
PH: 5 (ref 5.0–8.0)
Protein, ur: NEGATIVE mg/dL
Specific Gravity, Urine: 1.016 (ref 1.005–1.030)

## 2018-03-02 ENCOUNTER — Ambulatory Visit (INDEPENDENT_AMBULATORY_CARE_PROVIDER_SITE_OTHER): Payer: Commercial Managed Care - PPO | Admitting: Family Medicine

## 2018-03-02 ENCOUNTER — Encounter: Payer: Self-pay | Admitting: Family Medicine

## 2018-03-02 VITALS — BP 142/84 | HR 97 | Temp 98.7°F | Ht 59.25 in | Wt 161.1 lb

## 2018-03-02 DIAGNOSIS — J40 Bronchitis, not specified as acute or chronic: Secondary | ICD-10-CM

## 2018-03-02 DIAGNOSIS — J329 Chronic sinusitis, unspecified: Secondary | ICD-10-CM

## 2018-03-02 MED ORDER — LEVOFLOXACIN 500 MG PO TABS: 500.0000 mg | ORAL_TABLET | Freq: Every day | ORAL | 0 refills | Status: DC

## 2018-03-02 NOTE — Progress Notes (Signed)
Chief Complaint  Patient presents with  . Chills    HPI  Patient presents today for dry cough runny stuffy nose. Diffuse headache of moderate intensity. Patient also has chills and subjective fever. Onset 5 days ago. Seemed to be getting better. However yesterday started blowing purrulent rhinorrhea and coughing purulent, green sputum. Fever to 100.9 last night. No dspnea.   PMH: Smoking status noted ROS: Per HPI  Objective: BP (!) 142/84   Pulse 97   Temp 98.7 F (37.1 C) (Oral)   Ht 4' 11.25" (1.505 m)   Wt 161 lb 2 oz (73.1 kg)   BMI 32.27 kg/m  Gen: NAD, alert, cooperative with exam HEENT: NCAT, EOMI, PERRL CV: RRR, good S1/S2, no murmur Resp: CTABL, no wheezes, non-labored Abd: SNTND, BS present, no guarding or organomegaly Ext: No edema, warm Neuro: Alert and oriented, No gross deficits  Assessment and plan:  1. Sinobronchitis     Meds ordered this encounter  Medications  . levofloxacin (LEVAQUIN) 500 MG tablet    Sig: Take 1 tablet (500 mg total) by mouth daily.    Dispense:  7 tablet    Refill:  0      Follow up as needed.  Mechele Claude, MD

## 2018-03-04 ENCOUNTER — Other Ambulatory Visit (HOSPITAL_COMMUNITY): Payer: Self-pay | Admitting: Adult Health

## 2018-03-04 DIAGNOSIS — Z1231 Encounter for screening mammogram for malignant neoplasm of breast: Secondary | ICD-10-CM

## 2018-04-12 ENCOUNTER — Ambulatory Visit (HOSPITAL_COMMUNITY)
Admission: RE | Admit: 2018-04-12 | Discharge: 2018-04-12 | Disposition: A | Discharge status: Home / Self Care | Payer: Commercial Managed Care - PPO | Source: Ambulatory Visit | Attending: Adult Health | Admitting: Adult Health

## 2018-04-12 DIAGNOSIS — Z1231 Encounter for screening mammogram for malignant neoplasm of breast: Secondary | ICD-10-CM

## 2018-09-13 ENCOUNTER — Other Ambulatory Visit: Payer: Self-pay | Admitting: Family Medicine

## 2018-09-13 ENCOUNTER — Telehealth: Payer: Self-pay | Admitting: Family Medicine

## 2018-09-13 MED ORDER — EPINEPHRINE 0.3 MG/0.3ML IJ SOAJ: 2.0000 mg | Freq: Once | INTRAMUSCULAR | 5 refills | Status: DC

## 2018-09-13 NOTE — Telephone Encounter (Signed)
What is the name of the medication? Epipen  Have you contacted your pharmacy to request a refill? yes  Which pharmacy would you like this sent to? [facility]    Patient notified that their request is being sent to the clinical staff for review and that they should receive a call once it is complete. If they do not receive a call within 24 hours they can check with their pharmacy or our office.

## 2018-09-13 NOTE — Telephone Encounter (Signed)
Not on current med list Last RF 10/2017

## 2018-09-13 NOTE — Telephone Encounter (Signed)
I sent in the requested prescription 

## 2018-09-14 ENCOUNTER — Other Ambulatory Visit: Payer: Self-pay

## 2018-09-14 NOTE — Telephone Encounter (Signed)
Patient aware.

## 2018-09-16 ENCOUNTER — Ambulatory Visit (INDEPENDENT_AMBULATORY_CARE_PROVIDER_SITE_OTHER): Payer: Commercial Managed Care - PPO | Admitting: *Deleted

## 2018-09-16 ENCOUNTER — Other Ambulatory Visit: Payer: Self-pay

## 2018-09-16 DIAGNOSIS — Z23 Encounter for immunization: Secondary | ICD-10-CM

## 2018-09-16 DIAGNOSIS — Z1159 Encounter for screening for other viral diseases: Secondary | ICD-10-CM

## 2018-09-16 DIAGNOSIS — Z114 Encounter for screening for human immunodeficiency virus [HIV]: Secondary | ICD-10-CM

## 2018-09-16 NOTE — Progress Notes (Signed)
Pt here for Tdap vaccine Pt also requesting HIV and Hep C screening to close health maintenance gaps [facility] reviewed with pt Tdap showing from [place] 07/2018 Per pt, this is in error Tdap given Pt tolerated well Labs drawn [facility] per Dr Livia Snellen

## 2018-09-17 LAB — HIV ANTIBODY (ROUTINE TESTING W REFLEX): HIV Screen 4th Generation wRfx: NONREACTIVE

## 2018-09-17 LAB — HEPATITIS C ANTIBODY: Hep C Virus Ab: <0.1 s/co ratio (ref 0.0–0.9)

## 2018-09-17 NOTE — Progress Notes (Signed)
Hello Pretty,  Your lab result is normal and/or stable.Some minor variations that are not significant are commonly marked abnormal, but do not represent any medical problem for you.  Best regards, Claretta Fraise, M.D.

## 2018-09-23 ENCOUNTER — Other Ambulatory Visit: Payer: Commercial Managed Care - PPO | Admitting: Adult Health

## 2018-12-02 ENCOUNTER — Ambulatory Visit (INDEPENDENT_AMBULATORY_CARE_PROVIDER_SITE_OTHER): Payer: Commercial Managed Care - PPO | Admitting: Adult Health

## 2018-12-02 ENCOUNTER — Other Ambulatory Visit (HOSPITAL_COMMUNITY)
Admission: RE | Admit: 2018-12-02 | Discharge: 2018-12-02 | Disposition: A | Discharge status: Home / Self Care | Payer: Commercial Managed Care - PPO | Source: Ambulatory Visit | Attending: Obstetrics & Gynecology | Admitting: Obstetrics & Gynecology

## 2018-12-02 ENCOUNTER — Encounter: Payer: Self-pay | Admitting: Adult Health

## 2018-12-02 ENCOUNTER — Other Ambulatory Visit: Payer: Self-pay

## 2018-12-02 VITALS — BP 154/89 | HR 74 | Ht 60.0 in | Wt 160.0 lb

## 2018-12-02 DIAGNOSIS — Z1212 Encounter for screening for malignant neoplasm of rectum: Secondary | ICD-10-CM

## 2018-12-02 DIAGNOSIS — E7800 Pure hypercholesterolemia, unspecified: Secondary | ICD-10-CM

## 2018-12-02 DIAGNOSIS — Z01419 Encounter for gynecological examination (general) (routine) without abnormal findings: Secondary | ICD-10-CM | POA: Diagnosis present

## 2018-12-02 DIAGNOSIS — Z1211 Encounter for screening for malignant neoplasm of colon: Secondary | ICD-10-CM

## 2018-12-02 LAB — HEMOCCULT GUIAC POC 1CARD (OFFICE): Fecal Occult Blood, POC: NEGATIVE

## 2018-12-02 NOTE — Progress Notes (Signed)
Patient ID: Roberta Barrett, female   DOB: Jan 01, 1956, 63 y.o.   MRN: [id number] History of Present Illness: Roberta Barrett is a 63 year old white female, married, PM in for a well woman gyn exam and she requests pap. PCP is Dr Livia Snellen.   Current Medications, Allergies, Past Medical History, Past Surgical History, Family History and Social History were reviewed in [employer] record.     Review of Systems:  Patient denies any headaches, hearing loss, fatigue, blurred vision, shortness of breath, chest pain, abdominal pain, problems with bowel movements, urination, or intercourse. No joint pain or mood swings.   Physical Exam:BP (!) 154/89 (BP Location: Right Arm, Patient Position: Sitting, Cuff Size: Normal)   Pulse 74   Ht 5' (1.524 m)   Wt 160 lb (72.6 kg)   BMI 31.25 kg/m  General:  Well developed, well nourished, no acute distress Skin:  Warm and dry Neck:  Midline trachea, normal thyroid, good ROM, no lymphadenopathy,no carotid bruits heard Lungs; Clear to auscultation bilaterally Breast:  No dominant palpable mass, retraction, or nipple discharge Cardiovascular: Regular rate and rhythm, has +murmur Abdomen:  Soft, non tender, no hepatosplenomegaly Pelvic:  External genitalia is normal in appearance, no lesions.  The vagina is atrophic. Urethra has no lesions or masses. The cervix atrophic, pap with High risk HPV 16/18 genotyping performed.  Uterus is felt to be normal size, shape, and contour.  No adnexal masses or tenderness noted.Bladder is non tender, no masses felt. Rectal: Good sphincter tone, no polyps, or hemorrhoids felt.  Hemoccult negative. Extremities/musculoskeletal:  No swelling or varicosities noted, no clubbing or cyanosis Psych:  No mood changes, alert and cooperative,seems happy Fall risk is low PHQ 9 score is 0. Examination chaperoned by Weyman Croon FNP student who assisted with exam.  Impression and Plan: 1. Encounter for gynecological  examination with Papanicolaou smear of cervix Pap sent Physical in 1 year Pap in 3 if normal Check CBC,CMP,TSH and lipids Mammogram yearly Colonoscopy  per GI  2. Screening for colorectal cancer   3. Elevated cholesterol Check lipids

## 2018-12-02 NOTE — Addendum Note (Signed)
Addended by: Diona Fanti A on: 12/02/2018 11:15 AM   Modules accepted: Orders

## 2018-12-03 ENCOUNTER — Telehealth: Payer: Self-pay | Admitting: Adult Health

## 2018-12-03 LAB — COMPREHENSIVE METABOLIC PANEL
ALT: 22 IU/L (ref 0–32)
AST: 25 IU/L (ref 0–40)
Albumin/Globulin Ratio: 1.5 (ref 1.2–2.2)
Albumin: 4.5 g/dL (ref 3.8–4.8)
Alkaline Phosphatase: 91 IU/L (ref 39–117)
BUN/Creatinine Ratio: 13 (ref 12–28)
BUN: 13 mg/dL (ref 8–27)
Bilirubin Total: 0.5 mg/dL (ref 0.0–1.2)
CO2: 21 mmol/L (ref 20–29)
Calcium: 9.5 mg/dL (ref 8.7–10.3)
Chloride: 104 mmol/L (ref 96–106)
Creatinine, Ser: 0.97 mg/dL (ref 0.57–1.00)
GFR calc Af Amer: 72 mL/min/{1.73_m2} (ref >59)
GFR calc non Af Amer: 63 mL/min/{1.73_m2} (ref >59)
Globulin, Total: 3.1 g/dL (ref 1.5–4.5)
Glucose: 90 mg/dL (ref 65–99)
Potassium: 4 mmol/L (ref 3.5–5.2)
Sodium: 139 mmol/L (ref 134–144)
Total Protein: 7.6 g/dL (ref 6.0–8.5)

## 2018-12-03 LAB — CBC
Hematocrit: 33.5 % — ABNORMAL LOW (ref 34.0–46.6)
Hemoglobin: 10.6 g/dL — ABNORMAL LOW (ref 11.1–15.9)
MCH: 23.9 pg — ABNORMAL LOW (ref 26.6–33.0)
MCHC: 31.6 g/dL (ref 31.5–35.7)
MCV: 76 fL — ABNORMAL LOW (ref 79–97)
Platelets: 175 10*3/uL (ref 150–450)
RBC: 4.44 x10E6/uL (ref 3.77–5.28)
RDW: 15.2 % (ref 11.7–15.4)
WBC: 4.9 10*3/uL (ref 3.4–10.8)

## 2018-12-03 LAB — LIPID PANEL
Chol/HDL Ratio: 3.4 ratio (ref 0.0–4.4)
Cholesterol, Total: 180 mg/dL (ref 100–199)
HDL: 53 mg/dL (ref >39)
LDL Chol Calc (NIH): 107 mg/dL — ABNORMAL HIGH (ref 0–99)
Triglycerides: 114 mg/dL (ref 0–149)
VLDL Cholesterol Cal: 20 mg/dL (ref 5–40)

## 2018-12-03 LAB — TSH: TSH: 1.52 u[IU]/mL (ref 0.450–4.500)

## 2018-12-03 NOTE — Telephone Encounter (Signed)
Pt aware of labs and that HGB 10.6 she said  gave blood several weeks ago and it was 12.6

## 2018-12-07 LAB — CYTOLOGY - PAP
Comment: NEGATIVE
Diagnosis: NEGATIVE
High risk HPV: NEGATIVE

## 2019-01-21 ENCOUNTER — Other Ambulatory Visit: Payer: Self-pay

## 2019-01-21 ENCOUNTER — Ambulatory Visit (INDEPENDENT_AMBULATORY_CARE_PROVIDER_SITE_OTHER): Payer: Commercial Managed Care - PPO | Admitting: Urology

## 2019-01-21 ENCOUNTER — Other Ambulatory Visit (HOSPITAL_COMMUNITY)
Admission: AD | Admit: 2019-01-21 | Discharge: 2019-01-21 | Disposition: A | Discharge status: Home / Self Care | Payer: Commercial Managed Care - PPO | Source: Other Acute Inpatient Hospital | Attending: Urology | Admitting: Urology

## 2019-01-21 DIAGNOSIS — Z87442 Personal history of urinary calculi: Secondary | ICD-10-CM | POA: Diagnosis not present

## 2019-01-21 DIAGNOSIS — R3121 Asymptomatic microscopic hematuria: Secondary | ICD-10-CM

## 2019-01-21 DIAGNOSIS — T3121 Burns involving 20-29% of body surface with 10-19% third degree burns: Secondary | ICD-10-CM | POA: Insufficient documentation

## 2019-01-21 DIAGNOSIS — N3941 Urge incontinence: Secondary | ICD-10-CM | POA: Diagnosis not present

## 2019-01-21 DIAGNOSIS — Z87440 Personal history of urinary (tract) infections: Secondary | ICD-10-CM

## 2019-01-21 LAB — URINALYSIS, COMPLETE (UACMP) WITH MICROSCOPIC
Bilirubin Urine: NEGATIVE
Glucose, UA: NEGATIVE mg/dL
Ketones, ur: NEGATIVE mg/dL
Leukocytes,Ua: NEGATIVE
Nitrite: NEGATIVE
Protein, ur: NEGATIVE mg/dL
Specific Gravity, Urine: 1.019 (ref 1.005–1.030)
pH: 5 (ref 5.0–8.0)

## 2019-02-18 ENCOUNTER — Other Ambulatory Visit (HOSPITAL_COMMUNITY): Payer: Self-pay | Admitting: Obstetrics and Gynecology

## 2019-02-18 DIAGNOSIS — Z1231 Encounter for screening mammogram for malignant neoplasm of breast: Secondary | ICD-10-CM

## 2019-04-25 ENCOUNTER — Other Ambulatory Visit: Payer: Self-pay

## 2019-04-25 ENCOUNTER — Ambulatory Visit (HOSPITAL_COMMUNITY)
Admission: RE | Admit: 2019-04-25 | Discharge: 2019-04-25 | Disposition: A | Discharge status: Home / Self Care | Payer: Commercial Managed Care - PPO | Source: Ambulatory Visit | Attending: Obstetrics and Gynecology | Admitting: Obstetrics and Gynecology

## 2019-04-25 DIAGNOSIS — Z1231 Encounter for screening mammogram for malignant neoplasm of breast: Secondary | ICD-10-CM | POA: Insufficient documentation

## 2019-05-31 ENCOUNTER — Telehealth: Payer: Self-pay | Admitting: Family Medicine

## 2019-05-31 NOTE — Telephone Encounter (Signed)
Is it okay to provide letter.

## 2019-05-31 NOTE — Telephone Encounter (Signed)
Yes, please. WS 

## 2019-06-01 ENCOUNTER — Encounter: Payer: Self-pay | Admitting: *Deleted

## 2019-06-01 NOTE — Telephone Encounter (Signed)
Aware.  Letter ready. 

## 2019-12-05 ENCOUNTER — Other Ambulatory Visit (HOSPITAL_COMMUNITY)
Admission: RE | Admit: 2019-12-05 | Discharge: 2019-12-05 | Disposition: A | Discharge status: Home / Self Care | Payer: Commercial Managed Care - PPO | Source: Ambulatory Visit | Attending: Adult Health | Admitting: Adult Health

## 2019-12-05 ENCOUNTER — Ambulatory Visit (INDEPENDENT_AMBULATORY_CARE_PROVIDER_SITE_OTHER): Payer: Commercial Managed Care - PPO | Admitting: Adult Health

## 2019-12-05 ENCOUNTER — Encounter: Payer: Self-pay | Admitting: Adult Health

## 2019-12-05 VITALS — BP 184/89 | HR 77 | Ht 60.0 in | Wt 157.0 lb

## 2019-12-05 DIAGNOSIS — Z1211 Encounter for screening for malignant neoplasm of colon: Secondary | ICD-10-CM

## 2019-12-05 DIAGNOSIS — Z01419 Encounter for gynecological examination (general) (routine) without abnormal findings: Secondary | ICD-10-CM

## 2019-12-05 LAB — HEMOCCULT GUIAC POC 1CARD (OFFICE): Fecal Occult Blood, POC: NEGATIVE

## 2019-12-05 NOTE — Progress Notes (Addendum)
Patient ID: Roberta Barrett, female   DOB: 1955/06/26, 64 y.o.   MRN: [id number] History of Present Illness: Roberta Barrett is a 64 year old white female,married, PM in for a well woman gyn exam and she wants a pap. PCP is Dr Darlyn Read.   Current Medications, Allergies, Past Medical History, Past Surgical History, Family History and Social History were reviewed in [employer] record.     Review of Systems: Patient denies any headaches, hearing loss, fatigue, blurred vision, shortness of breath, chest pain, abdominal pain, problems with bowel movements, urination, or intercourse. No joint pain or Barrett swings.    Physical Exam:BP (!) 184/89 (BP Location: Right Arm, Patient Position: Sitting, Cuff Size: Normal)    Pulse 77    Ht 5' (1.524 m)    Wt 157 lb (71.2 kg)    BMI 30.66 kg/m  General:  Well developed, well nourished, no acute distress Skin:  Warm and dry Neck:  Midline trachea, normal thyroid, good ROM, no lymphadenopathy,no carotid bruits heard Lungs; Clear to auscultation bilaterally Breast:  No dominant palpable mass, retraction, or nipple discharge Cardiovascular: Regular rate and rhythm Abdomen:  Soft, non tender, no hepatosplenomegaly Pelvic:  External genitalia is normal in appearance, no lesions.  The vagina is pale with loss of moisture and rugae, tan discharge, no odor. Urethra has no lesions or masses. The cervix is tiny, pap with high risk HPV genotyping performed.  Uterus is felt to be normal size, shape, and contour.  No adnexal masses or tenderness noted.Bladder is non tender, no masses felt. Rectal: Good sphincter tone, no polyps, or hemorrhoids felt.  Hemoccult negative. Extremities/musculoskeletal:  No swelling or varicosities noted, no clubbing or cyanosis Psych:  No Barrett changes, alert and cooperative,seems happy AA is 0 PHQ 2 score is 0  [facility] - 12/05/19 1055      Pregnancy Intention Screening   Does the patient want to become pregnant in the  next year? No    Does the patient's partner want to become pregnant in the next year? No    Would the patient like to discuss contraceptive options today? No      Contraception Wrap Up   Current Method --   postmenopause   End Method --   postmenopause   Contraception Counseling Provided No          Examination chaperoned by Faith Rogue LPN  Impression and Plan:  1. Encounter for gynecological examination with Papanicolaou smear of cervix Pap sent Physical in 1 year Pap in 3 if normal Mammogram yearly Check CBC,CMP,TSH and lipids Colonoscopy per GI   2. Encounter for screening fecal occult blood testing

## 2019-12-06 LAB — COMPREHENSIVE METABOLIC PANEL
ALT: 16 IU/L (ref 0–32)
AST: 28 IU/L (ref 0–40)
Albumin/Globulin Ratio: 1.4 (ref 1.2–2.2)
Albumin: 4.5 g/dL (ref 3.8–4.8)
Alkaline Phosphatase: 90 IU/L (ref 44–121)
BUN/Creatinine Ratio: 12 (ref 12–28)
BUN: 11 mg/dL (ref 8–27)
Bilirubin Total: 0.5 mg/dL (ref 0.0–1.2)
CO2: 23 mmol/L (ref 20–29)
Calcium: 9 mg/dL (ref 8.7–10.3)
Chloride: 103 mmol/L (ref 96–106)
Creatinine, Ser: 0.94 mg/dL (ref 0.57–1.00)
GFR calc Af Amer: 75 mL/min/{1.73_m2} (ref >59)
GFR calc non Af Amer: 65 mL/min/{1.73_m2} (ref >59)
Globulin, Total: 3.3 g/dL (ref 1.5–4.5)
Glucose: 84 mg/dL (ref 65–99)
Potassium: 4.7 mmol/L (ref 3.5–5.2)
Sodium: 140 mmol/L (ref 134–144)
Total Protein: 7.8 g/dL (ref 6.0–8.5)

## 2019-12-06 LAB — CBC
Hematocrit: 38.5 % (ref 34.0–46.6)
Hemoglobin: 12.3 g/dL (ref 11.1–15.9)
MCH: 24.4 pg — ABNORMAL LOW (ref 26.6–33.0)
MCHC: 31.9 g/dL (ref 31.5–35.7)
MCV: 76 fL — ABNORMAL LOW (ref 79–97)
Platelets: 171 10*3/uL (ref 150–450)
RBC: 5.05 x10E6/uL (ref 3.77–5.28)
RDW: 15.6 % — ABNORMAL HIGH (ref 11.7–15.4)
WBC: 4.4 10*3/uL (ref 3.4–10.8)

## 2019-12-06 LAB — LIPID PANEL
Chol/HDL Ratio: 3.5 ratio (ref 0.0–4.4)
Cholesterol, Total: 205 mg/dL — ABNORMAL HIGH (ref 100–199)
HDL: 58 mg/dL (ref >39)
LDL Chol Calc (NIH): 127 mg/dL — ABNORMAL HIGH (ref 0–99)
Triglycerides: 112 mg/dL (ref 0–149)
VLDL Cholesterol Cal: 20 mg/dL (ref 5–40)

## 2019-12-06 LAB — TSH: TSH: 2.07 u[IU]/mL (ref 0.450–4.500)

## 2019-12-07 LAB — CYTOLOGY - PAP
Comment: NEGATIVE
Diagnosis: NEGATIVE
High risk HPV: NEGATIVE

## 2020-01-20 ENCOUNTER — Ambulatory Visit: Payer: Commercial Managed Care - PPO | Admitting: Urology

## 2020-03-01 ENCOUNTER — Ambulatory Visit: Payer: Commercial Managed Care - PPO | Admitting: Urology

## 2020-03-16 ENCOUNTER — Other Ambulatory Visit (HOSPITAL_COMMUNITY): Payer: Self-pay | Admitting: Adult Health

## 2020-03-16 DIAGNOSIS — Z1231 Encounter for screening mammogram for malignant neoplasm of breast: Secondary | ICD-10-CM

## 2020-04-11 NOTE — Progress Notes (Signed)
Subjective:  1. Microscopic hematuria   2. Personal history of urinary infection      CC: Frequency and urgency.   Roberta Barrett returns today in f/u. She has a history of recurrent UTI's and keeps macrodantin on hand for breakthroughs but has only had to use it once since her last visit. She is voiding with reduced frequently and has urgency on occasional with rare UUI. She has had no hematuria or dysuria. She had a stone in 1989 but none since and no worrisome symptoms.      ROS:  ROS:  A complete review of systems was performed.  All systems are negative except for pertinent findings as noted.   ROS  Allergies  Allergen Reactions  . Bee Venom Hives, Shortness Of Breath and Swelling  . Elemental Sulfur Rash  . Penicillins Rash    Outpatient Encounter Medications as of 04/12/2020  Medication Sig  . cholecalciferol (VITAMIN D) 1000 UNITS tablet Take 2,000 Units by mouth daily.  Marland Kitchen CRANBERRY EXTRACT PO Take 1 tablet by mouth daily.   Marland Kitchen EPINEPHrine 0.3 mg/0.3 mL IJ SOAJ injection Inject into the muscle.  . nitrofurantoin, macrocrystal-monohydrate, (MACROBID) 100 MG capsule Take 1 capsule (100 mg total) by mouth every 12 (twelve) hours.  . Omega 3-6-9 Fatty Acids (OMEGA 3-6-9 COMPLEX PO) Take 1 capsule by mouth daily.  Marland Kitchen EPINEPHrine (EPIPEN 2-PAK) 0.3 mg/0.3 mL IJ SOAJ injection Inject 2 mLs (2 mg total) into the muscle once for 1 dose.   No facility-administered encounter medications on file as of 04/12/2020.    Past Medical History:  Diagnosis Date  . Dysplasia of cervix, unspecified 80's  . Shingles 12/08/2014  . Vaginal Pap smear, abnormal     Past Surgical History:  Procedure Laterality Date  . BREAST BIOPSY Left    benign  . COLONOSCOPY N/A 09/15/2012   Procedure: COLONOSCOPY;  Surgeon: Malissa Hippo, MD;  [facility];  Service: Endoscopy;  Laterality: N/A;  915  . HAMMER TOE SURGERY    . LASER ABLATION OF THE CERVIX  1985  . laser conization      Social  History   Socioeconomic History  . Marital status: Married    Spouse name: Not on file  . Number of children: 0  . Years of education: Not on file  . Highest education level: Not on file  Occupational History  . Not on file  Tobacco Use  . Smoking status: Never Smoker  . Smokeless tobacco: Never Used  Vaping Use  . Vaping Use: Never used  Substance and Sexual Activity  . Alcohol use: No  . Drug use: No  . Sexual activity: Yes    Birth control/protection: Post-menopausal  Other Topics Concern  . Not on file  Social History Narrative  . Not on file   Social Determinants of Health   Financial Resource Strain: Low Risk   . Difficulty of Paying Living Expenses: Not hard at all  Food Insecurity: No Food Insecurity  . Worried About Programme researcher, broadcasting/film/video in the Last Year: Never true  . Ran Out of Food in the Last Year: Never true  Transportation Needs: No Transportation Needs  . Lack of Transportation (Medical): No  . Lack of Transportation (Non-Medical): No  Physical Activity: Sufficiently Active  . Days of Exercise per Week: 6 days  . Minutes of Exercise per Session: 40 min  Stress: No Stress Concern Present  . Feeling of Stress : Not at all  Social Connections:  Socially Integrated  . Frequency of Communication with Friends and Family: More than three times a week  . Frequency of Social Gatherings with Friends and Family: More than three times a week  . Attends Religious Services: More than 4 times per year  . Active Member of Clubs or Organizations: Yes  . Attends Banker Meetings: More than 4 times per year  . Marital Status: Married  Catering manager Violence: Not At Risk  . Fear of Current or Ex-Partner: No  . Emotionally Abused: No  . Physically Abused: No  . Sexually Abused: No    Family History  Problem Relation Age of Onset  . Hypertension Mother   . COPD Father   . Cancer Sister 26       cervical cancer  . Cancer Maternal Uncle   . Cancer  Paternal Aunt   . COPD Paternal Aunt   . Cancer Paternal Uncle   . COPD Paternal Uncle        Objective: Vitals:   04/12/20 1137  BP: (!) 183/83  Pulse: 84  Temp: 98.4 F (36.9 C)     Physical Exam  Lab Results:  Results for orders placed or performed in visit on 04/12/20 (from the past 24 hour(s))  Urinalysis, Routine w reflex microscopic     Status: Abnormal   Collection Time: 04/12/20 11:43 AM  Result Value Ref Range   Specific Gravity, UA 1.015 1.005 - 1.030   pH, UA 5.5 5.0 - 7.5   Color, UA Amber (A) Yellow   Appearance Ur Clear Clear   Leukocytes,UA Negative Negative   Protein,UA Negative Negative/Trace   Glucose, UA Negative Negative   Ketones, UA Negative Negative   RBC, UA Negative Negative   Bilirubin, UA Negative Negative   Urobilinogen, Ur 0.2 0.2 - 1.0 mg/dL   Nitrite, UA Negative Negative   Microscopic Examination See below:    Narrative   Performed at:  [address] - [facility] [address], [place], [place]  [id number] Lab Director: Chinita Pester MT, Phone:  [phone]  Microscopic Examination     Status: Abnormal   Collection Time: 04/12/20 11:43 AM   Urine  Result Value Ref Range   WBC, UA 0-5 0 - 5 /hpf   RBC 3-10 (A) 0 - 2 /hpf   Epithelial Cells (non renal) 0-10 0 - 10 /hpf   Renal Epithel, UA None seen None seen /hpf   Bacteria, UA Few (A) None seen/Few   Narrative   Performed at:  [address] - [facility] [address], [place], [place]  [id number] Lab Director: Chinita Pester MT, Phone:  [phone]      Assessment & Plan: Recurrent UTI's.  She has done well with only one symptomatic episode over the past year.   Macrobid refilled.  Microhematuria.  She has chronic stable hematuria.  Recheck UA at f/u.    Meds ordered this encounter  Medications  . nitrofurantoin, macrocrystal-monohydrate, (MACROBID) 100 MG capsule    Sig: Take 1 capsule (100 mg total) by mouth every 12 (twelve) hours.    Dispense:  14 capsule     Refill:  3     Orders Placed This Encounter  Procedures  . Microscopic Examination  . Urinalysis, Routine w reflex microscopic      Return in about 1 year (around 04/12/2021).   CC: Mechele Claude, MD      Bjorn Pippin 04/12/2020

## 2020-04-12 ENCOUNTER — Other Ambulatory Visit: Payer: Self-pay

## 2020-04-12 ENCOUNTER — Encounter: Payer: Self-pay | Admitting: Urology

## 2020-04-12 ENCOUNTER — Ambulatory Visit (INDEPENDENT_AMBULATORY_CARE_PROVIDER_SITE_OTHER): Payer: Commercial Managed Care - PPO | Admitting: Urology

## 2020-04-12 VITALS — BP 183/83 | HR 84 | Temp 98.4°F | Ht 60.0 in | Wt 155.0 lb

## 2020-04-12 DIAGNOSIS — Z87440 Personal history of urinary (tract) infections: Secondary | ICD-10-CM | POA: Diagnosis not present

## 2020-04-12 DIAGNOSIS — R3129 Other microscopic hematuria: Secondary | ICD-10-CM | POA: Diagnosis not present

## 2020-04-12 LAB — URINALYSIS, ROUTINE W REFLEX MICROSCOPIC
Bilirubin, UA: NEGATIVE
Glucose, UA: NEGATIVE
Ketones, UA: NEGATIVE
Leukocytes,UA: NEGATIVE
Nitrite, UA: NEGATIVE
Protein,UA: NEGATIVE
RBC, UA: NEGATIVE
Specific Gravity, UA: 1.015 (ref 1.005–1.030)
Urobilinogen, Ur: 0.2 mg/dL (ref 0.2–1.0)
pH, UA: 5.5 (ref 5.0–7.5)

## 2020-04-12 LAB — MICROSCOPIC EXAMINATION: Renal Epithel, UA: NONE SEEN /hpf

## 2020-04-12 MED ORDER — NITROFURANTOIN MONOHYD MACRO 100 MG PO CAPS: 100.0000 mg | ORAL_CAPSULE | Freq: Two times a day (BID) | ORAL | 3 refills | Status: DC

## 2020-04-12 NOTE — Progress Notes (Signed)

## 2020-04-26 ENCOUNTER — Other Ambulatory Visit: Payer: Self-pay

## 2020-04-26 ENCOUNTER — Ambulatory Visit (HOSPITAL_COMMUNITY)
Admission: RE | Admit: 2020-04-26 | Discharge: 2020-04-26 | Disposition: A | Discharge status: Home / Self Care | Payer: Commercial Managed Care - PPO | Source: Ambulatory Visit | Attending: Adult Health | Admitting: Adult Health

## 2020-04-26 DIAGNOSIS — Z1231 Encounter for screening mammogram for malignant neoplasm of breast: Secondary | ICD-10-CM | POA: Insufficient documentation

## 2020-09-05 ENCOUNTER — Other Ambulatory Visit: Payer: Self-pay

## 2020-09-05 DIAGNOSIS — Z87440 Personal history of urinary (tract) infections: Secondary | ICD-10-CM

## 2020-09-05 MED ORDER — NITROFURANTOIN MONOHYD MACRO 100 MG PO CAPS: 100.0000 mg | ORAL_CAPSULE | Freq: Two times a day (BID) | ORAL | 3 refills | Status: DC

## 2020-12-05 ENCOUNTER — Encounter: Payer: Self-pay | Admitting: Adult Health

## 2020-12-05 ENCOUNTER — Other Ambulatory Visit: Payer: Self-pay

## 2020-12-05 ENCOUNTER — Ambulatory Visit (INDEPENDENT_AMBULATORY_CARE_PROVIDER_SITE_OTHER): Payer: Commercial Managed Care - PPO | Admitting: Adult Health

## 2020-12-05 ENCOUNTER — Other Ambulatory Visit (HOSPITAL_COMMUNITY)
Admission: RE | Admit: 2020-12-05 | Discharge: 2020-12-05 | Disposition: A | Discharge status: Home / Self Care | Payer: Commercial Managed Care - PPO | Source: Ambulatory Visit | Attending: Adult Health | Admitting: Adult Health

## 2020-12-05 VITALS — BP 160/86 | HR 77 | Ht 60.0 in | Wt 153.0 lb

## 2020-12-05 DIAGNOSIS — Z01419 Encounter for gynecological examination (general) (routine) without abnormal findings: Secondary | ICD-10-CM | POA: Insufficient documentation

## 2020-12-05 DIAGNOSIS — Z1211 Encounter for screening for malignant neoplasm of colon: Secondary | ICD-10-CM

## 2020-12-05 DIAGNOSIS — E7800 Pure hypercholesterolemia, unspecified: Secondary | ICD-10-CM | POA: Diagnosis not present

## 2020-12-05 DIAGNOSIS — Z780 Asymptomatic menopausal state: Secondary | ICD-10-CM

## 2020-12-05 LAB — HEMOCCULT GUIAC POC 1CARD (OFFICE): Fecal Occult Blood, POC: NEGATIVE

## 2020-12-05 NOTE — Progress Notes (Signed)
Patient ID: [name], female   DOB: April 24, 1955, 65 y.o.   MRN: [id number] History of Present Illness: Roberta Barrett is a 65 year old white female.married, PM in for a well woman gyn exam and pap. PCP is Dr Roberta Barrett.  Lab Results  Component Value Date   DIAGPAP  12/05/2019    - Negative for intraepithelial lesion or malignancy (NILM)   DIAGPAP - Atrophic vaginitis 12/05/2019   HPV NOT DETECTED 09/21/2017   HPVHIGH Negative 12/05/2019    Current Medications, Allergies, Past Medical History, Past Surgical History, Family History and Social History were reviewed in [employer] record.     Review of Systems: Patient denies any headaches, hearing loss, fatigue, blurred vision, shortness of breath, chest pain, abdominal pain, problems with bowel movements, urination, or intercourse. No joint pain or mood swings.  Denies any vaginal bleeding She feel an broke her right wrist in August, had surgery and doing great .   Physical Exam:BP (!) 160/86 (BP Location: Left Arm, Patient Position: Sitting, Cuff Size: Normal)   Pulse 77   Ht 5' (1.524 m)   Wt 153 lb (69.4 kg)   BMI 29.88 kg/m   General:  Well developed, well nourished, no acute distress Skin:  Warm and dry Neck:  Midline trachea, normal thyroid, good ROM, no lymphadenopathy,no carotid bruits heard Lungs; Clear to auscultation bilaterally Breast:  No dominant palpable mass, retraction, or nipple discharge Cardiovascular: Regular rate and rhythm Abdomen:  Soft, non tender, no hepatosplenomegaly Pelvic:  External genitalia is normal in appearance, no lesions.  The vagina is atrophic. Urethra has no lesions or masses. The cervix is smooth, pap with HR HPV genotyping done at her request.  Uterus is felt to be normal size, shape, and contour.  No adnexal masses or tenderness noted.Bladder is non tender, no masses felt. Rectal: Good sphincter tone, no polyps, or hemorrhoids felt.  Hemoccult  negative. Extremities/musculoskeletal:  No swelling or varicosities noted, no clubbing or cyanosis Psych:  No mood changes, alert and cooperative,seems happy AA is 0  Fall risk is low Depression screen [facility] 2/9 12/05/2020 12/05/2019 12/02/2018  Decreased Interest 0 0 0  Down, Depressed, Hopeless 0 0 0  PHQ - 2 Score 0 0 0  Altered sleeping 0 0 0  Tired, decreased energy 0 0 0  Change in appetite 0 0 0  Feeling bad or failure about yourself  0 0 0  Trouble concentrating 0 0 0  Moving slowly or fidgety/restless 0 0 0  Suicidal thoughts 0 0 0  PHQ-9 Score 0 0 0    GAD 7 : Generalized Anxiety Score 12/05/2020 12/05/2019  Nervous, Anxious, on Edge 0 0  Control/stop worrying 0 0  Worry too much - different things 0 0  Trouble relaxing 0 0  Restless 0 0  Easily annoyed or irritable 0 0  Afraid - awful might happen 0 0  Total GAD 7 Score 0 0   .  Upstream - 12/05/20 1050       Pregnancy Intention Screening   Does the patient want to become pregnant in the next year? N/A    Does the patient's partner want to become pregnant in the next year? N/A    Would the patient like to discuss contraceptive options today? N/A      Contraception Wrap Up   Current Method --   PM   End Method --   PM   Contraception Counseling Provided No  Examination chaperoned by Faith Rogue LPN  Impression and Plan: 1. Encounter for gynecological examination with Papanicolaou smear of cervix Pap sent Physical in 1 year Pap in 3 if normal She requests labs, Check CBC,CMP,TSH and lipids  - Cytology - PAP( ) - CBC - Comprehensive metabolic panel - TSH - Lipid panel Mammogram yearly Colonoscopy per GI  2. Encounter for screening fecal occult blood testing - POCT occult blood stool  3. Postmenopause  4. Elevated cholesterol  - Lipid panel

## 2020-12-06 LAB — COMPREHENSIVE METABOLIC PANEL
ALT: 11 IU/L (ref 0–32)
AST: 23 IU/L (ref 0–40)
Albumin/Globulin Ratio: 1.8 (ref 1.2–2.2)
Albumin: 4.8 g/dL (ref 3.8–4.8)
Alkaline Phosphatase: 88 IU/L (ref 44–121)
BUN/Creatinine Ratio: 20 (ref 12–28)
BUN: 16 mg/dL (ref 8–27)
Bilirubin Total: 0.5 mg/dL (ref 0.0–1.2)
CO2: 23 mmol/L (ref 20–29)
Calcium: 9.2 mg/dL (ref 8.7–10.3)
Chloride: 102 mmol/L (ref 96–106)
Creatinine, Ser: 0.79 mg/dL (ref 0.57–1.00)
Globulin, Total: 2.7 g/dL (ref 1.5–4.5)
Glucose: 82 mg/dL (ref 70–99)
Potassium: 4 mmol/L (ref 3.5–5.2)
Sodium: 140 mmol/L (ref 134–144)
Total Protein: 7.5 g/dL (ref 6.0–8.5)
eGFR: 83 mL/min/{1.73_m2} (ref >59)

## 2020-12-06 LAB — CBC
Hematocrit: 40.9 % (ref 34.0–46.6)
Hemoglobin: 13.6 g/dL (ref 11.1–15.9)
MCH: 27.6 pg (ref 26.6–33.0)
MCHC: 33.3 g/dL (ref 31.5–35.7)
MCV: 83 fL (ref 79–97)
Platelets: 166 10*3/uL (ref 150–450)
RBC: 4.93 x10E6/uL (ref 3.77–5.28)
RDW: 14.6 % (ref 11.7–15.4)
WBC: 4.7 10*3/uL (ref 3.4–10.8)

## 2020-12-06 LAB — LIPID PANEL
Chol/HDL Ratio: 3.3 ratio (ref 0.0–4.4)
Cholesterol, Total: 209 mg/dL — ABNORMAL HIGH (ref 100–199)
HDL: 64 mg/dL (ref >39)
LDL Chol Calc (NIH): 135 mg/dL — ABNORMAL HIGH (ref 0–99)
Triglycerides: 55 mg/dL (ref 0–149)
VLDL Cholesterol Cal: 10 mg/dL (ref 5–40)

## 2020-12-06 LAB — TSH: TSH: 1.24 u[IU]/mL (ref 0.450–4.500)

## 2020-12-11 LAB — CYTOLOGY - PAP
Adequacy: ABNORMAL
Comment: NEGATIVE

## 2020-12-15 IMAGING — MG DIGITAL SCREENING BILAT W/ TOMO W/ CAD
8 series · 8 of 24 positions shown · non-contrast
Comparison: Previous exam(s).

CLINICAL DATA: Screening.

EXAM:
DIGITAL SCREENING BILATERAL MAMMOGRAM WITH TOMO AND CAD

[L CC synth-2D]
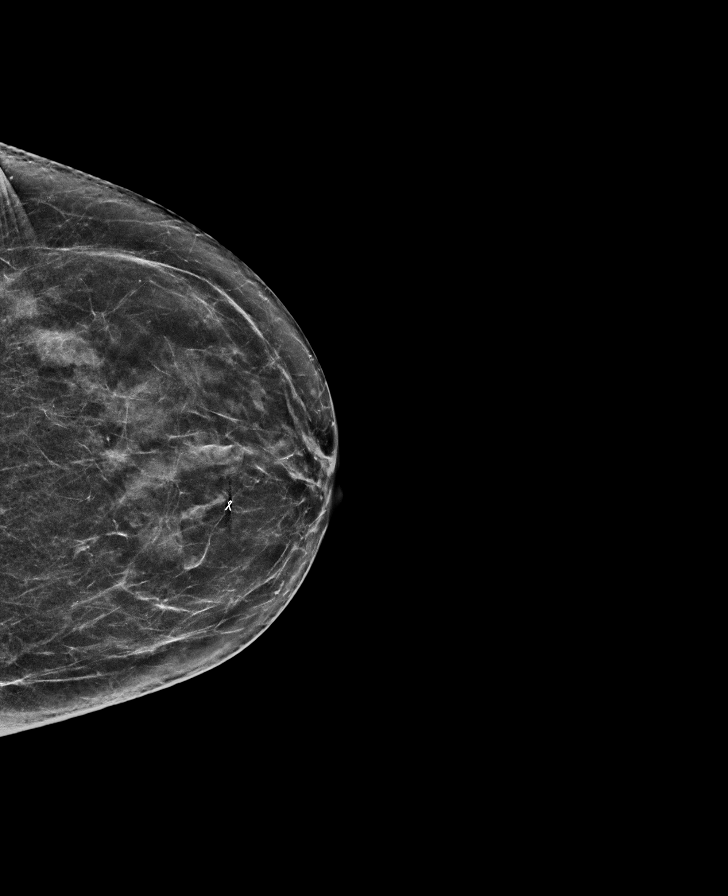

[L MLO synth-2D]
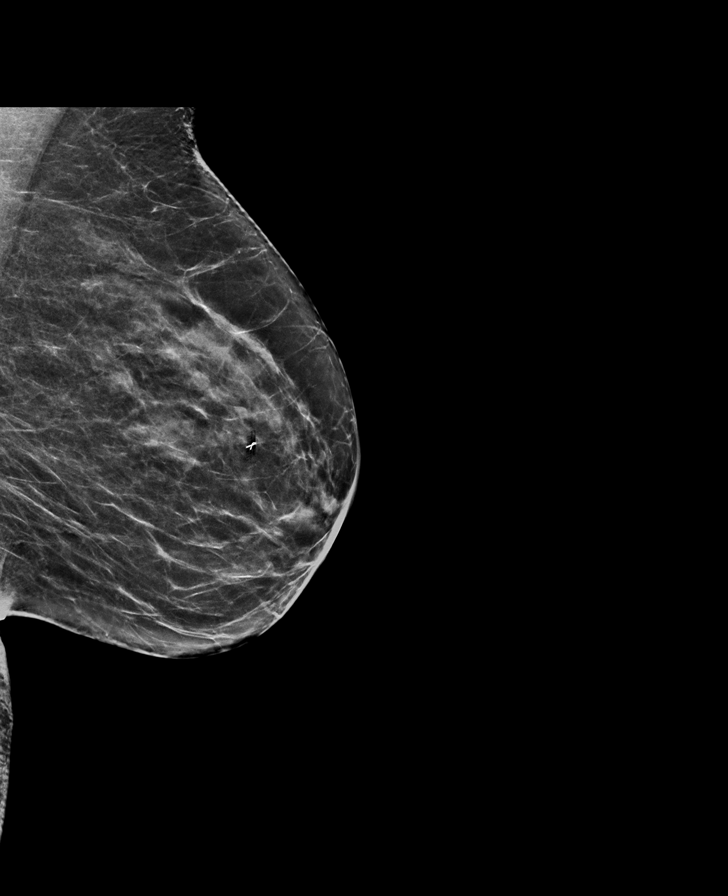

[R MLO synth-2D]
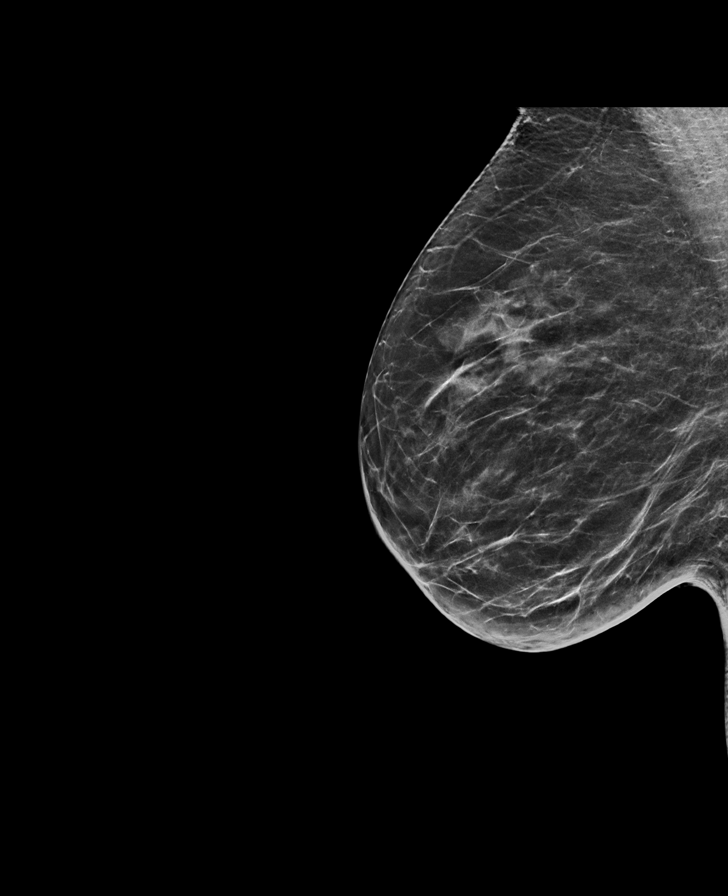

[R CC synth-2D]
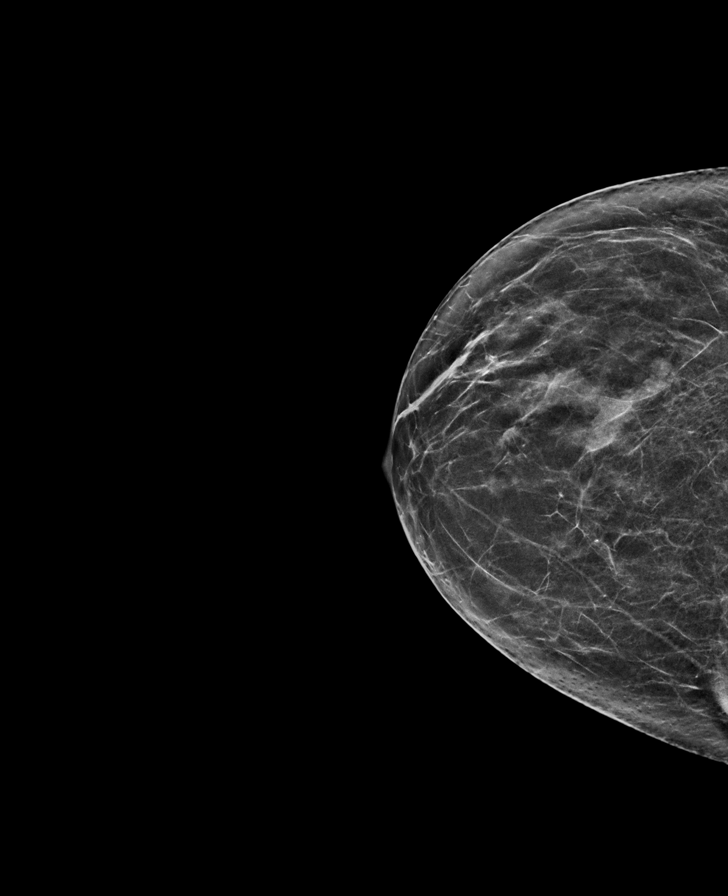

[R CC tomo · tomo slice 30/59.0]
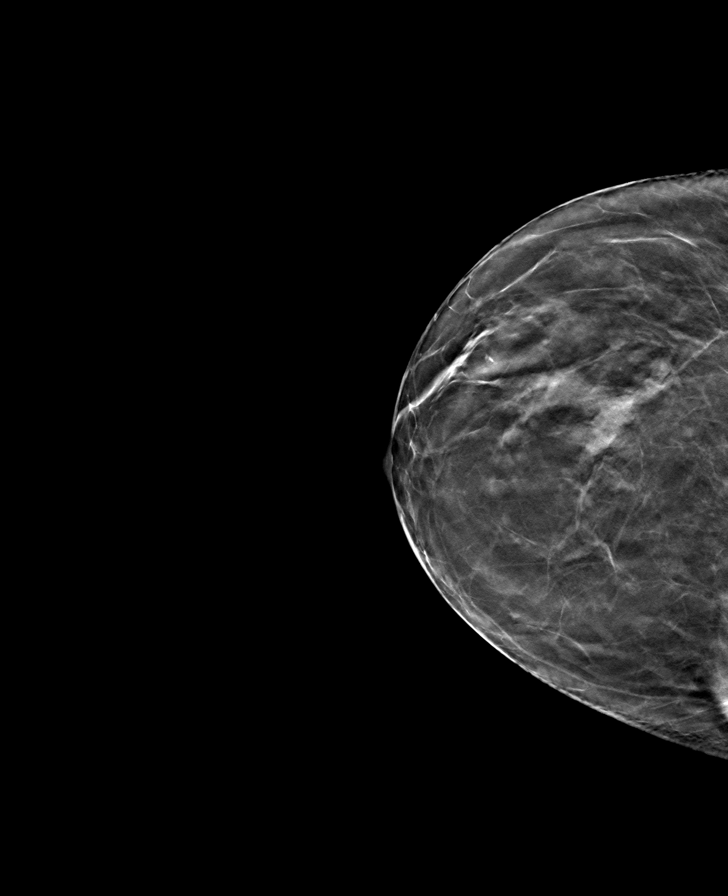

[L MLO tomo · tomo slice 33/66.0]
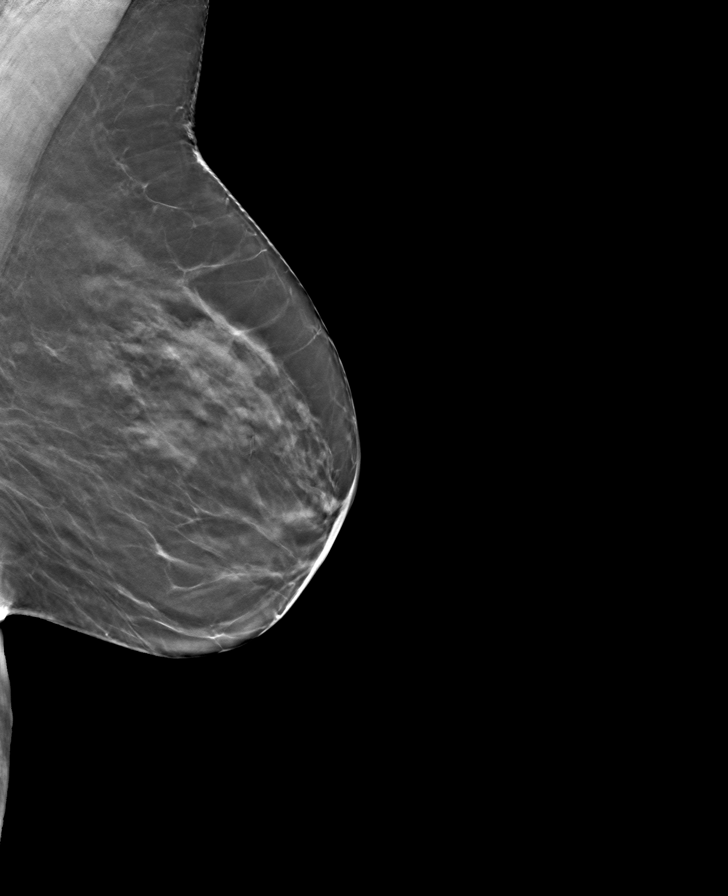

[R MLO tomo · tomo slice 34/67.0]
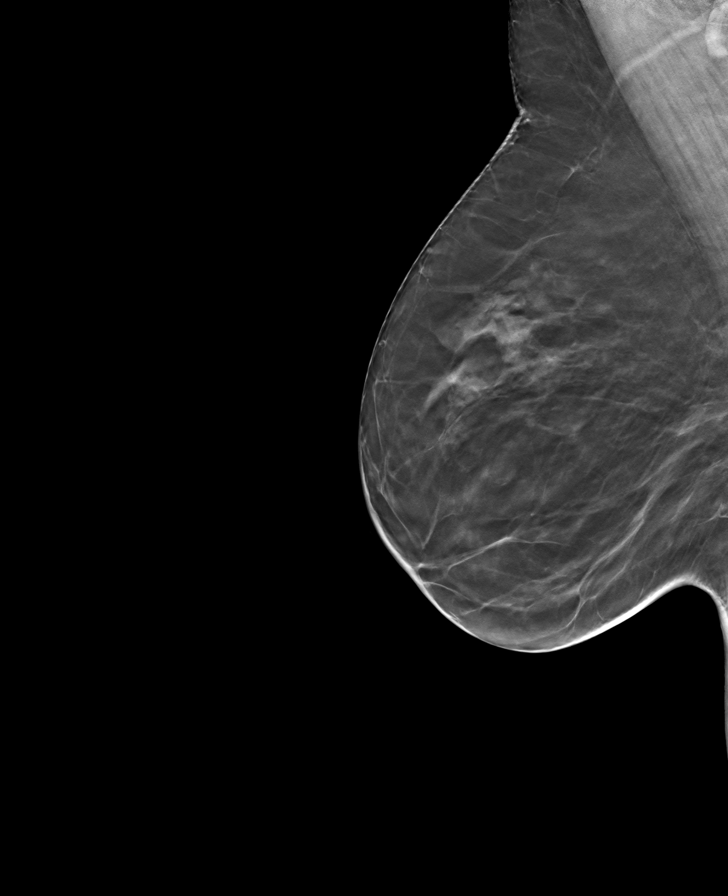

[L CC tomo · tomo slice 31/62.0]
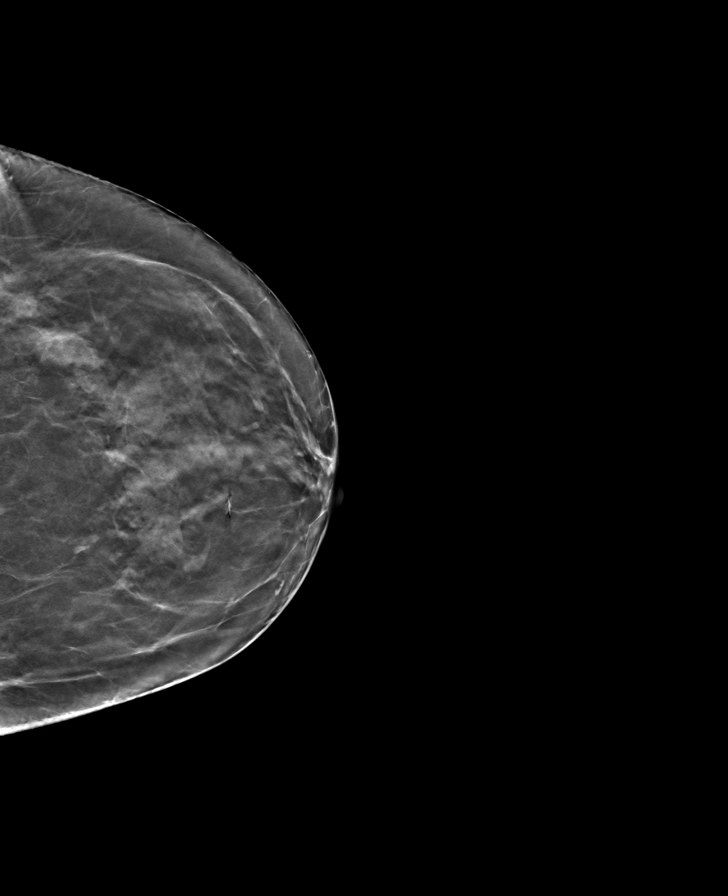

[8 of 24 positions shown; findings below may reference images not displayed]

ACR Breast Density Category b: There are scattered areas of
fibroglandular density.
FINDINGS: There are no findings suspicious for malignancy. Images were
processed with CAD.
IMPRESSION: No mammographic evidence of malignancy. A result letter of this
screening mammogram will be mailed directly to the patient.

RECOMMENDATION:
Screening mammogram in one year. (Code:[id number])

BI-RADS CATEGORY  1: Negative.

## 2020-12-21 ENCOUNTER — Other Ambulatory Visit (HOSPITAL_COMMUNITY)
Admission: RE | Admit: 2020-12-21 | Discharge: 2020-12-21 | Disposition: A | Discharge status: Home / Self Care | Payer: Commercial Managed Care - PPO | Source: Ambulatory Visit | Attending: Adult Health | Admitting: Adult Health

## 2020-12-21 ENCOUNTER — Other Ambulatory Visit: Payer: Self-pay

## 2020-12-21 ENCOUNTER — Encounter: Payer: Self-pay | Admitting: Adult Health

## 2020-12-21 ENCOUNTER — Ambulatory Visit (INDEPENDENT_AMBULATORY_CARE_PROVIDER_SITE_OTHER): Payer: Commercial Managed Care - PPO | Admitting: Adult Health

## 2020-12-21 VITALS — BP 157/86 | HR 83 | Ht 60.0 in | Wt 153.0 lb

## 2020-12-21 DIAGNOSIS — N952 Postmenopausal atrophic vaginitis: Secondary | ICD-10-CM

## 2020-12-21 DIAGNOSIS — Z124 Encounter for screening for malignant neoplasm of cervix: Secondary | ICD-10-CM

## 2020-12-21 DIAGNOSIS — Z1211 Encounter for screening for malignant neoplasm of colon: Secondary | ICD-10-CM | POA: Insufficient documentation

## 2020-12-21 NOTE — Progress Notes (Signed)
  Subjective:     Patient ID: Roberta Barrett, female   DOB: 1955-11-28, 65 y.o.   MRN: [id number]  HPI Roberta Barrett is a 65 year old white female,married, PM in for repeat pap, had pap and physical in October and pap, as below.  Lab Results  Component Value Date   DIAGPAP - Non-diagnostic (A) 12/05/2020   HPV NOT DETECTED 09/21/2017   HPVHIGH Other 12/05/2020   PCP is Dr Darlyn Read. Review of Systems Patient denies any headaches, hearing loss, fatigue, blurred vision, shortness of breath, chest pain, abdominal pain, problems with bowel movements, urination, or intercourse. No joint pain or mood swings.  Reviewed past medical,surgical, social and family history. Reviewed medications and allergies.     Objective:   Physical Exam BP (!) 157/86 (BP Location: Left Arm, Patient Position: Sitting, Cuff Size: Normal)   Pulse 83   Ht 5' (1.524 m)   Wt 153 lb (69.4 kg)   BMI 29.88 kg/m     Skin warm and dry.Pelvic: external genitalia is normal in appearance no lesions, vagina:is atrophic, has thin yellow discharge, no odor,urethra has no lesions or masses noted, cervix:smooth, pap repeated, uterus: normal size, shape and contour, non tender, no masses felt, adnexa: no masses or tenderness noted. Bladder is non tender and no masses felt. Fall risk is moderate  Upstream - 12/21/20 0842       Pregnancy Intention Screening   Does the patient want to become pregnant in the next year? No    Does the patient's partner want to become pregnant in the next year? No    Would the patient like to discuss contraceptive options today? No      Contraception Wrap Up   Current Method No Method - Other Reason   postmenopausal   End Method No Method - Other Reason   postmenopausal   Contraception Counseling Provided No            Examination chaperoned by Malachy Mood LPN  Assessment:     1. Routine cervical smear Pap repeated  2. Atrophic vaginitis Discussed PVC or Metrogel if persists     Plan:      Physical in 1 year

## 2021-01-07 ENCOUNTER — Telehealth: Payer: Self-pay | Admitting: Adult Health

## 2021-01-07 NOTE — Telephone Encounter (Signed)
Pt aware pap back and atrophic vaginitis negative malignancy and HPV. Her mom died 01-10-21 and funeral yesterday, [place]

## 2021-01-08 ENCOUNTER — Encounter: Payer: Self-pay | Admitting: Adult Health

## 2021-02-13 ENCOUNTER — Encounter: Payer: Self-pay | Admitting: Family Medicine

## 2021-02-13 NOTE — Telephone Encounter (Signed)
They will need office visit or televisit

## 2021-03-04 ENCOUNTER — Other Ambulatory Visit (HOSPITAL_COMMUNITY): Payer: Self-pay | Admitting: Adult Health

## 2021-03-04 DIAGNOSIS — Z1231 Encounter for screening mammogram for malignant neoplasm of breast: Secondary | ICD-10-CM

## 2021-03-13 ENCOUNTER — Encounter: Payer: Self-pay | Admitting: Family Medicine

## 2021-03-13 ENCOUNTER — Other Ambulatory Visit: Payer: Self-pay | Admitting: Family Medicine

## 2021-03-14 MED ORDER — EPINEPHRINE 0.3 MG/0.3ML IJ SOAJ: 0.3000 mg | INTRAMUSCULAR | 1 refills | Status: DC | PRN

## 2021-04-11 ENCOUNTER — Other Ambulatory Visit: Payer: Self-pay

## 2021-04-11 ENCOUNTER — Encounter: Payer: Self-pay | Admitting: Urology

## 2021-04-11 ENCOUNTER — Ambulatory Visit (INDEPENDENT_AMBULATORY_CARE_PROVIDER_SITE_OTHER): Payer: Commercial Managed Care - PPO | Admitting: Urology

## 2021-04-11 VITALS — BP 183/82 | HR 91 | Wt 153.0 lb

## 2021-04-11 DIAGNOSIS — R3129 Other microscopic hematuria: Secondary | ICD-10-CM | POA: Diagnosis not present

## 2021-04-11 DIAGNOSIS — N3941 Urge incontinence: Secondary | ICD-10-CM

## 2021-04-11 DIAGNOSIS — Z87440 Personal history of urinary (tract) infections: Secondary | ICD-10-CM | POA: Diagnosis not present

## 2021-04-11 LAB — URINALYSIS, ROUTINE W REFLEX MICROSCOPIC
Bilirubin, UA: NEGATIVE
Glucose, UA: NEGATIVE
Ketones, UA: NEGATIVE
Leukocytes,UA: NEGATIVE
Nitrite, UA: NEGATIVE
Protein,UA: NEGATIVE
Specific Gravity, UA: 1.02 (ref 1.005–1.030)
Urobilinogen, Ur: 0.2 mg/dL (ref 0.2–1.0)
pH, UA: 6 (ref 5.0–7.5)

## 2021-04-11 LAB — MICROSCOPIC EXAMINATION: Renal Epithel, UA: NONE SEEN /hpf

## 2021-04-11 LAB — BLADDER SCAN AMB NON-IMAGING: Scan Result: 9

## 2021-04-11 MED ORDER — NITROFURANTOIN MONOHYD MACRO 100 MG PO CAPS: 100.0000 mg | ORAL_CAPSULE | Freq: Two times a day (BID) | ORAL | 3 refills | Status: DC

## 2021-04-11 NOTE — Progress Notes (Signed)
?Subjective: ? ?1. Personal history of urinary infection   ?2. Microscopic hematuria   ?3. Urge incontinence   ?  ? ?CC: Frequency and urgency.  ? ?Roberta Barrett returns today in f/u. She has a history of recurrent UTI's and keeps macrodantin on hand for breakthroughs but has only had to use it once since her last visit. She had some increased frequency over the last week but she decided to hold off on the med until she can get checked.  She continues to have urgency on occasional with rare UUI but has no SUI and doesn't require pad. She has had no hematuria or dysuria. She had a stone in 1989 but none since and no worrisome symptoms.   UA is unremarkable today.  ? ?  ? ?ROS: ? ?ROS:  ?A complete review of systems was performed.  All systems are negative except for pertinent findings as noted.  ? ?Review of Systems  ?Genitourinary:  Positive for frequency and urgency.  ? ?Allergies  ?Allergen Reactions  ? Bee Venom Hives, Shortness Of Breath and Swelling  ? Elemental Sulfur Rash  ? Penicillins Rash  ? ? ?Outpatient Encounter Medications as of 04/11/2021  ?Medication Sig  ? cholecalciferol (VITAMIN D) 1000 UNITS tablet Take 2,000 Units by mouth daily.  ? CRANBERRY EXTRACT PO Take 1 tablet by mouth daily.   ? EPINEPHrine 0.3 mg/0.3 mL IJ SOAJ injection Inject 0.3 mg into the muscle as needed for anaphylaxis.  ? Multiple Vitamins-Minerals (HAIR SKIN AND NAILS FORMULA PO) Take by mouth.  ? nitrofurantoin, macrocrystal-monohydrate, (MACROBID) 100 MG capsule Take 1 capsule (100 mg total) by mouth every 12 (twelve) hours.  ? Omega 3-6-9 Fatty Acids (OMEGA 3-6-9 COMPLEX PO) Take 1 capsule by mouth daily.  ? UNABLE TO FIND Vein & Leg circulation-daily  ? ?No facility-administered encounter medications on file as of 04/11/2021.  ? ? ?Past Medical History:  ?Diagnosis Date  ? Dysplasia of cervix, unspecified 80's  ? Shingles 12/08/2014  ? Vaginal Pap smear, abnormal   ? ? ?Past Surgical History:  ?Procedure Laterality Date  ? BREAST  BIOPSY Left   ? benign  ? CATARACT EXTRACTION    ? COLONOSCOPY N/A 09/15/2012  ? Procedure: COLONOSCOPY;  Surgeon: Malissa Hippo, MD;  [facility]: [facility];  Service: Endoscopy;  Laterality: N/A;  915  ? EYE SURGERY    ? HAMMER TOE SURGERY    ? LASER ABLATION OF THE CERVIX  1985  ? laser conization    ? WRIST SURGERY    ? ? ?Social History  ? ?Socioeconomic History  ? Marital status: Married  ?  Spouse name: Not on file  ? Number of children: 0  ? Years of education: Not on file  ? Highest education level: Not on file  ?Occupational History  ? Not on file  ?Tobacco Use  ? Smoking status: Never  ? Smokeless tobacco: Never  ?Vaping Use  ? Vaping Use: Never used  ?Substance and Sexual Activity  ? Alcohol use: No  ? Drug use: No  ? Sexual activity: Yes  ?  Birth control/protection: Post-menopausal  ?Other Topics Concern  ? Not on file  ?Social History Narrative  ? Not on file  ? ?Social Determinants of Health  ? ?Financial Resource Strain: Low Risk   ? Difficulty of Paying Living Expenses: Not hard at all  ?Food Insecurity: No Food Insecurity  ? Worried About Programme researcher, broadcasting/film/video in the Last Year: Never true  ? Ran  Out of Food in the Last Year: Never true  ?Transportation Needs: No Transportation Needs  ? Lack of Transportation (Medical): No  ? Lack of Transportation (Non-Medical): No  ?Physical Activity: Sufficiently Active  ? Days of Exercise per Week: 5 days  ? Minutes of Exercise per Session: 30 min  ?Stress: No Stress Concern Present  ? Feeling of Stress : Not at all  ?Social Connections: Moderately Integrated  ? Frequency of Communication with Friends and Family: More than three times a week  ? Frequency of Social Gatherings with Friends and Family: Twice a week  ? Attends Religious Services: More than 4 times per year  ? Active Member of Clubs or Organizations: No  ? Attends Banker Meetings: Never  ? Marital Status: Married  ?Intimate Partner Violence: Not At Risk  ? Fear of Current or  Ex-Partner: No  ? Emotionally Abused: No  ? Physically Abused: No  ? Sexually Abused: No  ? ? ?Family History  ?Problem Relation Age of Onset  ? Hypertension Mother   ? COPD Father   ? Cancer Sister 70  ?     cervical cancer  ? Cancer Maternal Uncle   ? Cancer Paternal Aunt   ? COPD Paternal Aunt   ? Cancer Paternal Uncle   ? COPD Paternal Uncle   ? ? ? ? ? ?Objective: ?Vitals:  ? 04/11/21 1022  ?BP: (!) 183/82  ?Pulse: 91  ? ? ? ?Physical Exam ? ?Lab Results:  ?Results for orders placed or performed in visit on 04/11/21 (from the past 24 hour(s))  ?Urinalysis, Routine w reflex microscopic     Status: Abnormal  ? Collection Time: 04/11/21 11:33 AM  ?Result Value Ref Range  ? Specific Gravity, UA 1.020 1.005 - 1.030  ? pH, UA 6.0 5.0 - 7.5  ? Color, UA Yellow Yellow  ? Appearance Ur Clear Clear  ? Leukocytes,UA Negative Negative  ? Protein,UA Negative Negative/Trace  ? Glucose, UA Negative Negative  ? Ketones, UA Negative Negative  ? RBC, UA Trace (A) Negative  ? Bilirubin, UA Negative Negative  ? Urobilinogen, Ur 0.2 0.2 - 1.0 mg/dL  ? Nitrite, UA Negative Negative  ? Microscopic Examination See below:   ? Narrative  ? Performed at:  01 - [facility] ?[address] [place], [place]  [id number] ?Lab Director: Chinita Pester MT, Phone:  [phone]  ?Microscopic Examination     Status: Abnormal  ? Collection Time: 04/11/21 11:33 AM  ? Urine  ?Result Value Ref Range  ? WBC, UA 0-5 0 - 5 /hpf  ? RBC 0-2 0 - 2 /hpf  ? Epithelial Cells (non renal) 0-10 0 - 10 /hpf  ? Renal Epithel, UA None seen None seen /hpf  ? Bacteria, UA Few (A) None seen/Few  ? Narrative  ? Performed at:  01 - [facility] ?[address] [place], [place]  [id number] ?Lab Director: Chinita Pester MT, Phone:  [phone]  ? ? ? ? ? ?Assessment & Plan: ?Recurrent UTI's.  She has done well with only one symptomatic episode over the past year.   Macrobid refilled. ? ?Microhematuria.  She has only trace blood today. ? ?UUI.   She has  minimal UUI that doesn't require treatment.   ? ?Meds ordered this encounter  ?Medications  ? nitrofurantoin, macrocrystal-monohydrate, (MACROBID) 100 MG capsule  ?  Sig: Take 1 capsule (100 mg total) by mouth every 12 (twelve) hours.  ?  Dispense:  14 capsule  ?  Refill:  3  ?  ? ?Orders Placed This Encounter  ?Procedures  ? Microscopic Examination  ? Urinalysis, Routine w reflex microscopic  ? BLADDER SCAN AMB NON-IMAGING  ?  ? ? ?Return in about 1 year (around 04/12/2022). ? ? ?CC: Mechele Claude, MD   ? ? ? ?Bjorn Pippin ?04/11/2021 ? ?

## 2021-04-24 ENCOUNTER — Encounter: Payer: Self-pay | Admitting: Family Medicine

## 2021-04-24 ENCOUNTER — Ambulatory Visit (INDEPENDENT_AMBULATORY_CARE_PROVIDER_SITE_OTHER): Payer: Commercial Managed Care - PPO | Admitting: Family Medicine

## 2021-04-24 VITALS — BP 151/79 | HR 90 | Temp 97.7°F | Ht 60.0 in | Wt 154.2 lb

## 2021-04-24 DIAGNOSIS — Z0000 Encounter for general adult medical examination without abnormal findings: Secondary | ICD-10-CM

## 2021-04-24 NOTE — Progress Notes (Signed)
? ?Subjective:  ?Patient ID: Roberta Barrett, female    DOB: [birth year]  Age: 66 y.o. MRN: [id number] ? ?CC: Establish Care ? ? ?HPI ?Roberta Barrett presents for new patient evaluation.  ? ?[facility] 04/24/2021 04/24/2021 12/05/2020  ?Decreased Interest 0 0 0  ?Down, Depressed, Hopeless 0 0 0  ?PHQ - 2 Score 0 0 0  ?Altered sleeping - - 0  ?Tired, decreased energy - - 0  ?Change in appetite - - 0  ?Feeling bad or failure about yourself  - - 0  ?Trouble concentrating - - 0  ?Moving slowly or fidgety/restless - - 0  ?Suicidal thoughts - - 0  ?PHQ-9 Score - - 0  ? ? 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  ? ?She has a past surgical history that includes laser conization; Laser ablation of the cervix (1985); Colonoscopy (N/A, 09/15/2012); Hammer toe surgery; Breast biopsy (Left); Wrist surgery; Eye surgery; and Cataract extraction.  ? ?Her family history includes COPD in her father, paternal aunt, and paternal uncle; Cancer in her maternal uncle, paternal aunt, and paternal uncle; Cancer (age of onset: 2659) in her sister; Hypertension in her mother.She reports that she has never smoked. She has never used smokeless tobacco. She reports that she does not drink alcohol and does not use drugs. ? ? ? ?ROS ?Review of Systems  ?Constitutional: Negative.   ?HENT:  Negative for congestion.   ?Eyes:  Negative for visual disturbance.  ?Respiratory:  Negative for shortness of breath.   ?Cardiovascular:  Negative for chest pain.  ?Gastrointestinal:  Negative for abdominal pain, constipation, diarrhea, nausea and vomiting.  ?Genitourinary:  Negative for difficulty urinating.  ?Musculoskeletal:  Negative for arthralgias and myalgias.  ?Neurological:  Negative for headaches.  ?Psychiatric/Behavioral:  Negative for sleep disturbance.   ? ?Objective:  ?BP (!) 151/79   Pulse 90   Temp 97.7 ?F (36.5 ?C)   Ht 5'  (1.524 m)   Wt 154 lb 3.2 oz (69.9 kg)   SpO2 100%   BMI 30.12 kg/m?  ? ?BP Readings from Last 3 Encounters:  ?04/24/21 (!) 151/79  ?04/11/21 (!) 183/82  ?12/21/20 (!) 157/86  ? ? ?Wt Readings from Last 3 Encounters:  ?04/24/21 154 lb 3.2 oz (69.9 kg)  ?04/11/21 153 lb (69.4 kg)  ?12/21/20 153 lb (69.4 kg)  ? ? ? ?Physical Exam ?Constitutional:   ?   General: She is not in acute distress. ?   Appearance: She is well-developed.  ?HENT:  ?   Head: Normocephalic and atraumatic.  ?Eyes:  ?   Conjunctiva/sclera: Conjunctivae normal.  ?   Pupils: Pupils are equal, round, and reactive to light.  ?Neck:  ?   Thyroid: No thyromegaly.  ?Cardiovascular:  ?   Rate and Rhythm: Normal rate and regular rhythm.  ?   Heart sounds: Normal heart sounds. No murmur heard. ?Pulmonary:  ?   Effort: Pulmonary effort is normal. No respiratory distress.  ?   Breath sounds: Normal breath sounds. No wheezing or rales.  ?Abdominal:  ?   General: Bowel sounds are normal. There is no distension.  ?   Palpations: Abdomen is soft.  ?   Tenderness: There is no abdominal tenderness.  ?Musculoskeletal:     ?   General: Normal range of motion.  ?   Cervical back: Normal range of motion and neck supple.  ?Lymphadenopathy:  ?  Cervical: No cervical adenopathy.  ?Skin: ?   General: Skin is warm and dry.  ?Neurological:  ?   Mental Status: She is alert and oriented to person, place, and time.  ?Psychiatric:     ?   Behavior: Behavior normal.     ?   Thought Content: Thought content normal.     ?   Judgment: Judgment normal.  ? ? ? ? ?Assessment & Plan:  ? ?Roberta Barrett was seen today for establish care. ? ?Diagnoses and all orders for this visit: ? ?Well adult exam ? ? ? ? ? ? ?I am having Roberta Barrett maintain her cholecalciferol, Omega 3-6-9 Fatty Acids (OMEGA 3-6-9 COMPLEX PO), CRANBERRY EXTRACT PO, Multiple Vitamins-Minerals (HAIR SKIN AND NAILS FORMULA PO), UNABLE TO FIND, EPINEPHrine, nitrofurantoin (macrocrystal-monohydrate), and UNABLE TO  FIND. ? ?Allergies as of 04/24/2021   ? ?   Reactions  ? Bee Venom Hives, Shortness Of Breath, Swelling  ? Elemental Sulfur Rash  ? Penicillins Rash  ? ?  ? ?  ?Medication List  ?  ? ?  ? Accurate as of April 24, 2021  5:54 PM. If you have any questions, ask your nurse or doctor.  ?  ?  ? ?  ? ?cholecalciferol 1000 units tablet ?Commonly known as: VITAMIN D ?Take 2,000 Units by mouth daily. ?  ?CRANBERRY EXTRACT PO ?Take 1 tablet by mouth daily. ?  ?EPINEPHrine 0.3 mg/0.3 mL Soaj injection ?Commonly known as: EPI-PEN ?Inject 0.3 mg into the muscle as needed for anaphylaxis. ?  ?HAIR SKIN AND NAILS FORMULA PO ?Take by mouth. ?  ?nitrofurantoin (macrocrystal-monohydrate) 100 MG capsule ?Commonly known as: MACROBID ?Take 1 capsule (100 mg total) by mouth every 12 (twelve) hours. ?  ?OMEGA 3-6-9 COMPLEX PO ?Take 1 capsule by mouth daily. ?  ?UNABLE TO FIND ?Vein & Leg circulation-daily ?  ?UNABLE TO FIND ?Med Name: Phytoceramides 320 mg with 500 mcg Biotin ?  ? ?  ? ?Recent labs performed elsewhere reviewed with pt. ? ?Follow-up: Return in about 1 year (around 04/25/2022). ? ?Claretta Fraise, M.D. ?

## 2021-04-24 NOTE — Patient Instructions (Signed)
Fat and Cholesterol Restricted Eating Plan ?Eating a diet that limits fat and cholesterol may help lower your risk for heart disease and other conditions. Your body needs fat and cholesterol for basic functions, but eating too much of these things can be harmful to your health. ?Your health care provider may order lab tests to check your blood fat (lipid) and cholesterol levels. This helps your health care provider understand your risk for certain conditions and whether you need to make diet changes. Work with your health care provider or dietitian to make an eating plan that is right for you. ? ?What are tips for following this plan? ?General guidelines ?If you are overweight, work with your health care provider to lose weight safely. Losing just 5-10% of your body weight can improve your overall health and help prevent diseases such as diabetes and heart disease. ?Avoid: ?Foods with added sugar. ?Fried foods. ?Foods that contain partially hydrogenated oils, including stick margarine, some tub margarines, cookies, crackers, and other baked goods. ?If you drink alcohol: ?Limit how much you have to: ?0-1 drink a day for women who are not pregnant. ?0-2 drinks a day for men. ?Know how much alcohol is in a drink. In the [place] one drink equals one 12 oz bottle of beer (355 mL), one 5 oz glass of wine (148 mL), or one 1? oz glass of hard liquor (44 mL). ?Reading food labels ?Check food labels for: ?Trans fats or partially hydrogenated oils. Avoid foods that contain these. ?High amounts of saturated fat. Choose foods that are low in saturated fat (less than 2 g). ?The amount of cholesterol in each serving. ?The amount of fiber in each serving. ?Choose foods with healthy fats, such as: ?Monounsaturated and polyunsaturated fats. These include olive and canola oil, flaxseeds, walnuts, almonds, and seeds. ?Omega-3 fats. These are found in foods such as salmon, mackerel, sardines, tuna, flaxseed oil, and ground  flaxseeds. ?Choose grain products that have whole grains. Look for the word "whole" as the first word in the ingredient list. ?Cooking ?Cook foods using methods other than frying. Baking, boiling, grilling, and broiling are some healthy options. ?Eat more home-cooked food and less restaurant, buffet, and fast food. ?Avoid cooking using saturated fats. ?Animal sources of saturated fats include meats, butter, and cream. ?Plant sources of saturated fats include palm oil, palm kernel oil, and coconut oil. ?Meal planning ? ?At meals, imagine dividing your plate into fourths: ?Fill one-half of your plate with vegetables, green salads, and fruit. ?Fill one-fourth of your plate with whole grains. ?Fill one-fourth of your plate with lean protein foods. ?Eat fish that is high in omega-3 fats at least two times a week. ?Eat more foods that contain fiber, such as whole grains, beans, apples, pears, berries, broccoli, carrots, peas, and barley. These foods help promote healthy cholesterol levels in the blood. ?What foods should I eat? ?Fruits ?All fresh, canned (in natural juice), or frozen fruits. ?Vegetables ?Fresh or frozen vegetables (raw, steamed, roasted, or grilled). Green salads. ?Grains ?Whole grains, such as whole wheat or whole grain breads, crackers, cereals, and pasta. Unsweetened oatmeal, bulgur, barley, quinoa, or brown rice. Corn or whole wheat flour tortillas. ?Meats and other proteins ?Ground beef (85% or leaner), grass-fed beef, or beef trimmed of fat. Skinless chicken or [place]. Ground chicken or [place]. Pork trimmed of fat. All fish and seafood. Egg whites. Dried beans, peas, or lentils. Unsalted nuts or seeds. Unsalted canned beans. Natural nut butters without added sugar and oil. ?Dairy ?Low-fat  or nonfat dairy products, such as skim or 1% milk, 2% or reduced-fat cheeses, low-fat and fat-free ricotta or cottage cheese, or plain low-fat and nonfat yogurt. ?Fats and oils ?Tub margarine without trans fats.  Light or reduced-fat mayonnaise and salad dressings. Avocado. Olive, canola, sesame, or safflower oils. ?The items listed above may not be a complete list of foods and beverages you can eat. Contact a dietitian for more information. ?What foods should I avoid? ?Fruits ?Canned fruit in heavy syrup. Fruit in cream or butter sauce. Fried fruit. ?Vegetables ?Vegetables cooked in cheese, cream, or butter sauce. Fried vegetables. ?Grains ?White bread. White pasta. White rice. Cornbread. Bagels, pastries, and croissants. Crackers and snack foods that contain trans fat and hydrogenated oils. ?Meats and other proteins ?Fatty cuts of meat. Ribs, chicken wings, bacon, sausage, bologna, salami, chitterlings, fatback, hot dogs, bratwurst, and packaged lunch meats. Liver and organ meats. Whole eggs and egg yolks. Chicken and [place] with skin. Fried meat. ?Dairy ?Whole or 2% milk, cream, half-and-half, and cream cheese. Whole milk cheeses. Whole-fat or sweetened yogurt. Full-fat cheeses. Nondairy creamers and whipped toppings. Processed cheese, cheese spreads, and cheese curds. ?Fats and oils ?Butter, stick margarine, lard, shortening, ghee, or bacon fat. Coconut, palm kernel, and palm oils. ?Beverages ?Alcohol. Sugar-sweetened drinks such as sodas, lemonade, and fruit drinks. ?Sweets and desserts ?Corn syrup, sugars, honey, and molasses. Candy. Jam and jelly. Syrup. Sweetened cereals. Cookies, pies, cakes, donuts, muffins, and ice cream. ?The items listed above may not be a complete list of foods and beverages you should avoid. Contact a dietitian for more information. ?Summary ?Your body needs fat and cholesterol for basic functions. However, eating too much of these things can be harmful to your health. ?Work with your health care provider and dietitian to follow a diet that limits fat and cholesterol. Doing this may help lower your risk for heart disease and other conditions. ?Choose healthy fats, such as monounsaturated and  polyunsaturated fats, and foods high in omega-3 fatty acids. ?Eat fiber-rich foods, such as whole grains, beans, peas, fruits, and vegetables. ?Limit or avoid alcohol, fried foods, and foods high in saturated fats, partially hydrogenated oils, and sugar. ?This information is not intended to replace advice given to you by your health care provider. Make sure you discuss any questions you have with your health care provider. ?Document Revised: 06/08/2020 Document Reviewed: 06/08/2020 ?Elsevier Patient Education ? [place]. ? ?

## 2021-04-29 ENCOUNTER — Ambulatory Visit (HOSPITAL_COMMUNITY)
Admission: RE | Admit: 2021-04-29 | Discharge: 2021-04-29 | Disposition: A | Discharge status: Home / Self Care | Payer: Commercial Managed Care - PPO | Source: Ambulatory Visit | Attending: Adult Health | Admitting: Adult Health

## 2021-04-29 ENCOUNTER — Other Ambulatory Visit: Payer: Self-pay

## 2021-04-29 DIAGNOSIS — Z1231 Encounter for screening mammogram for malignant neoplasm of breast: Secondary | ICD-10-CM | POA: Diagnosis present

## 2021-12-06 ENCOUNTER — Encounter: Payer: Medicare HMO | Admitting: Adult Health

## 2021-12-09 NOTE — Progress Notes (Signed)
This encounter was created in error - please disregard.

## 2021-12-19 ENCOUNTER — Other Ambulatory Visit: Payer: Self-pay | Admitting: Adult Health

## 2021-12-19 DIAGNOSIS — E7800 Pure hypercholesterolemia, unspecified: Secondary | ICD-10-CM

## 2021-12-19 DIAGNOSIS — Z01419 Encounter for gynecological examination (general) (routine) without abnormal findings: Secondary | ICD-10-CM

## 2021-12-19 DIAGNOSIS — Z1329 Encounter for screening for other suspected endocrine disorder: Secondary | ICD-10-CM

## 2021-12-19 NOTE — Progress Notes (Signed)
jl

## 2021-12-21 LAB — LIPID PANEL
Chol/HDL Ratio: 3 ratio (ref 0.0–4.4)
Cholesterol, Total: 192 mg/dL (ref 100–199)
HDL: 63 mg/dL (ref >39)
LDL Chol Calc (NIH): 112 mg/dL — ABNORMAL HIGH (ref 0–99)
Triglycerides: 94 mg/dL (ref 0–149)
VLDL Cholesterol Cal: 17 mg/dL (ref 5–40)

## 2021-12-21 LAB — COMPREHENSIVE METABOLIC PANEL
ALT: 14 IU/L (ref 0–32)
AST: 21 IU/L (ref 0–40)
Albumin/Globulin Ratio: 1.6 (ref 1.2–2.2)
Albumin: 4.6 g/dL (ref 3.9–4.9)
Alkaline Phosphatase: 76 IU/L (ref 44–121)
BUN/Creatinine Ratio: 13 (ref 12–28)
BUN: 12 mg/dL (ref 8–27)
Bilirubin Total: 0.9 mg/dL (ref 0.0–1.2)
CO2: 24 mmol/L (ref 20–29)
Calcium: 9.3 mg/dL (ref 8.7–10.3)
Chloride: 101 mmol/L (ref 96–106)
Creatinine, Ser: 0.89 mg/dL (ref 0.57–1.00)
Globulin, Total: 2.8 g/dL (ref 1.5–4.5)
Glucose: 77 mg/dL (ref 70–99)
Potassium: 3.9 mmol/L (ref 3.5–5.2)
Sodium: 138 mmol/L (ref 134–144)
Total Protein: 7.4 g/dL (ref 6.0–8.5)
eGFR: 72 mL/min/{1.73_m2} (ref >59)

## 2021-12-21 LAB — CBC
Hematocrit: 40.2 % (ref 34.0–46.6)
Hemoglobin: 13 g/dL (ref 11.1–15.9)
MCH: 27.1 pg (ref 26.6–33.0)
MCHC: 32.3 g/dL (ref 31.5–35.7)
MCV: 84 fL (ref 79–97)
Platelets: 145 10*3/uL — ABNORMAL LOW (ref 150–450)
RBC: 4.8 x10E6/uL (ref 3.77–5.28)
RDW: 14.4 % (ref 11.7–15.4)
WBC: 4.2 10*3/uL (ref 3.4–10.8)

## 2021-12-21 LAB — TSH: TSH: 1.72 u[IU]/mL (ref 0.450–4.500)

## 2021-12-23 ENCOUNTER — Encounter: Payer: Self-pay | Admitting: Adult Health

## 2021-12-23 ENCOUNTER — Ambulatory Visit (INDEPENDENT_AMBULATORY_CARE_PROVIDER_SITE_OTHER): Payer: Medicare HMO | Admitting: Adult Health

## 2021-12-23 ENCOUNTER — Other Ambulatory Visit (HOSPITAL_COMMUNITY)
Admission: RE | Admit: 2021-12-23 | Discharge: 2021-12-23 | Disposition: A | Discharge status: Home / Self Care | Payer: Medicare HMO | Source: Ambulatory Visit | Attending: Adult Health | Admitting: Adult Health

## 2021-12-23 VITALS — BP 178/86 | HR 72 | Ht 60.0 in | Wt 154.2 lb

## 2021-12-23 DIAGNOSIS — Z01419 Encounter for gynecological examination (general) (routine) without abnormal findings: Secondary | ICD-10-CM | POA: Diagnosis present

## 2021-12-23 DIAGNOSIS — R030 Elevated blood-pressure reading, without diagnosis of hypertension: Secondary | ICD-10-CM | POA: Diagnosis not present

## 2021-12-23 DIAGNOSIS — Z780 Asymptomatic menopausal state: Secondary | ICD-10-CM

## 2021-12-23 DIAGNOSIS — Z1211 Encounter for screening for malignant neoplasm of colon: Secondary | ICD-10-CM | POA: Diagnosis not present

## 2021-12-23 DIAGNOSIS — Z1151 Encounter for screening for human papillomavirus (HPV): Secondary | ICD-10-CM | POA: Insufficient documentation

## 2021-12-23 DIAGNOSIS — N952 Postmenopausal atrophic vaginitis: Secondary | ICD-10-CM | POA: Insufficient documentation

## 2021-12-23 LAB — HEMOCCULT GUIAC POC 1CARD (OFFICE): Fecal Occult Blood, POC: NEGATIVE

## 2021-12-23 NOTE — Progress Notes (Signed)
Patient ID: Roberta Barrett, female   DOB: 05-26-1955, 66 y.o.   MRN: [id number] History of Present Illness: Roberta Barrett is a 66 year old white female,married, PM, in for a well woman gyn exam and pap. Her mom died last year the day before Thanksgiving at 11. She is going to the beach this year for Thanksgiving.   PCP is Dr Darlyn Read   Current Medications, Allergies, Past Medical History, Past Surgical History, Family History and Social History were reviewed in [employer] electronic medical record.     Review of Systems: Patient denies any headaches, hearing loss, fatigue, blurred vision, shortness of breath, chest pain, abdominal pain, problems with bowel movements, urination, or intercourse. No joint pain or mood swings.  Denies any vaginal bleeding   Physical Exam:BP (!) 178/86 (BP Location: Left Arm, Patient Position: Sitting, Cuff Size: Normal)   Pulse 72   Ht 5' (1.524 m)   Wt 154 lb 4 oz (70 kg)   BMI 30.12 kg/m   General:  Well developed, well nourished, no acute distress Skin:  Warm and dry Neck:  Midline trachea, normal thyroid, good ROM, no lymphadenopathy,no carotid bruits heard Lungs; Clear to auscultation bilaterally Breast:  No dominant palpable mass, retraction, or nipple discharge Cardiovascular: Regular rate and rhythm Abdomen:  Soft, non tender, no hepatosplenomegaly Pelvic:  External genitalia is normal in appearance, no lesions.  The vagina is pale and atrophic. Urethra has no lesions or masses. The cervix is smooth,and atrophic, pap with HR HPV genotyping performed at her request. .  Uterus is felt to be normal size, shape, and contour.  No adnexal masses or tenderness noted.Bladder is non tender, no masses felt. Rectal: Good sphincter tone, no polyps, or hemorrhoids felt.  Hemoccult negative. Extremities/musculoskeletal:  No swelling or varicosities noted, no clubbing or cyanosis Psych:  No mood changes, alert and cooperative,seems happy AA [place] 0 Fall risk is  low    12/23/2021    2:13 PM 04/24/2021   11:03 AM 04/24/2021   10:58 AM  Depression screen PHQ 2/9  Decreased Interest 0 0 0  Down, Depressed, Hopeless 0 0 0  PHQ - 2 Score 0 0 0  Altered sleeping 0    Tired, decreased energy 0    Change in appetite 0    Feeling bad or failure about yourself  0    Trouble concentrating 0    Moving slowly or fidgety/restless 0    Suicidal thoughts 0    PHQ-9 Score 0         12/23/2021    2:13 PM 12/05/2020   10:50 AM 12/05/2019   10:54 AM  GAD 7 : Generalized Anxiety Score  Nervous, Anxious, on Edge 0 0 0  Control/stop worrying 0 0 0  Worry too much - different things 0 0 0  Trouble relaxing 0 0 0  Restless 0 0 0  Easily annoyed or irritable 0 0 0  Afraid - awful might happen 0 0 0  Total GAD 7 Score 0 0 0      Upstream - 12/23/21 1413       Pregnancy Intention Screening   Does the patient want to become pregnant in the next year? N/A    Does the patient's partner want to become pregnant in the next year? N/A    Would the patient like to discuss contraceptive options today? No      Contraception Wrap Up   Current Method No Method - Other Reason   post  menopausal   End Method No Method - Other Reason   post menopausal   Contraception Counseling Provided No             Examination chaperoned by Dorthula Perfect RN  Impression and Plan: 1. Encounter for gynecological examination with Papanicolaou smear of cervix Pap sent Pap in 3 years if normal Physical in 1 year Labs reviewed with her Mammogram was negative 04/29/21  Colonoscopy per GI   2. Encounter for screening fecal occult blood testing Hemoccult was negative   3. Postmenopause Will get DEXA scan at [facility] 01/07/22 at 9:30 am  4. Elevated BP without diagnosis of hypertension She is keeping check at home   5. Vaginal atrophy

## 2021-12-27 LAB — CYTOLOGY - PAP
Comment: NEGATIVE
Comment: NEGATIVE
Comment: NEGATIVE
Diagnosis: NEGATIVE
HPV 16: NEGATIVE
HPV 18 / 45: NEGATIVE
High risk HPV: POSITIVE — AB

## 2021-12-30 ENCOUNTER — Encounter: Payer: Self-pay | Admitting: Adult Health

## 2021-12-30 DIAGNOSIS — R87810 Cervical high risk human papillomavirus (HPV) DNA test positive: Secondary | ICD-10-CM | POA: Insufficient documentation

## 2021-12-30 NOTE — Telephone Encounter (Signed)
Pt aware of +HPV, will repeat pap in 1 year.

## 2022-01-07 ENCOUNTER — Ambulatory Visit (HOSPITAL_COMMUNITY)
Admission: RE | Admit: 2022-01-07 | Discharge: 2022-01-07 | Disposition: A | Discharge status: Home / Self Care | Payer: Medicare HMO | Source: Ambulatory Visit | Attending: Adult Health | Admitting: Adult Health

## 2022-01-07 ENCOUNTER — Other Ambulatory Visit: Payer: Self-pay | Admitting: Adult Health

## 2022-01-07 ENCOUNTER — Encounter: Payer: Self-pay | Admitting: Adult Health

## 2022-01-07 DIAGNOSIS — Z780 Asymptomatic menopausal state: Secondary | ICD-10-CM | POA: Diagnosis present

## 2022-01-07 DIAGNOSIS — M810 Age-related osteoporosis without current pathological fracture: Secondary | ICD-10-CM | POA: Insufficient documentation

## 2022-01-07 MED ORDER — RISEDRONATE SODIUM 150 MG PO TABS: 150.0000 mg | ORAL_TABLET | ORAL | 12 refills | Status: DC

## 2022-01-14 ENCOUNTER — Telehealth: Payer: Self-pay | Admitting: Family Medicine

## 2022-01-14 NOTE — Telephone Encounter (Signed)
Patient aware and verbalizes understanding. 

## 2022-01-14 NOTE — Telephone Encounter (Signed)
DXA and medication was ordered by GYN - report for DXA is in chart please review and advise

## 2022-01-14 NOTE — Telephone Encounter (Signed)
She has osteoporosis and should take fosamax weekly.

## 2022-01-14 NOTE — Telephone Encounter (Signed)
The risk is very small for either problem. Just make sure you get regular dental check ups. Hair loss is something you can monitor. IF it starts to thin. Discontinue the medicine and let me know.

## 2022-01-14 NOTE — Telephone Encounter (Signed)
Patient aware and states that she is concerned with the side effects - hair and teeth loss.  Would like to know your thoughts.

## 2022-01-15 ENCOUNTER — Telehealth: Payer: Self-pay | Admitting: Adult Health

## 2022-01-15 NOTE — Telephone Encounter (Signed)
Pt would like detailed information about what her dexa scan results mean. Also wanted to know how much vitamin d and calcium to take. I told her Victorino Dike recommended 2000 of vit d and 600 mg of calcium. Victorino Dike will call patient later to discuss results further.

## 2022-01-15 NOTE — Telephone Encounter (Signed)
Discussed DEXA scan

## 2022-01-15 NOTE — Telephone Encounter (Signed)
Pt had a bone density test and would like for a call back.

## 2022-01-16 ENCOUNTER — Telehealth: Payer: Self-pay

## 2022-01-16 NOTE — Telephone Encounter (Signed)
PATIENT WOULD LIKE FOR YOU TO CALL HER.

## 2022-01-16 NOTE — Telephone Encounter (Signed)
Spoke with patient regarding her questions. She wanted to know if she could play pickle ball. Advised this wasn't a problem, she is encouraged to stay active. No other questions at this time.

## 2022-01-21 ENCOUNTER — Ambulatory Visit: Payer: Medicare HMO | Admitting: Adult Health

## 2022-02-18 ENCOUNTER — Other Ambulatory Visit: Payer: Self-pay | Admitting: Family Medicine

## 2022-02-18 ENCOUNTER — Other Ambulatory Visit (HOSPITAL_COMMUNITY): Payer: Self-pay | Admitting: Adult Health

## 2022-02-18 ENCOUNTER — Telehealth: Payer: Self-pay | Admitting: Family Medicine

## 2022-02-18 DIAGNOSIS — Z1211 Encounter for screening for malignant neoplasm of colon: Secondary | ICD-10-CM

## 2022-02-18 DIAGNOSIS — Z1231 Encounter for screening mammogram for malignant neoplasm of breast: Secondary | ICD-10-CM

## 2022-02-18 NOTE — Telephone Encounter (Signed)
REFERRAL REQUEST Telephone Note  Have you been seen at our office for this problem? No. Routine (Advise that they may need an appointment with their PCP before a referral can be done)  Reason for Referral: annual colonoscopy Referral discussed with patient: no  Best contact number of patient for referral team: [phone]    Has patient been seen by a specialist for this issue before: yes  [facility] provider [facility] for referral: [facility] or [facility] Patient location preference for referral: [facility] OR [place]   Patient notified that referrals can take up to a week or longer to process. If they haven't heard anything within a week they should call back and speak with the referral department.

## 2022-02-18 NOTE — Telephone Encounter (Signed)
Referral placed, as requested WS 

## 2022-02-19 NOTE — Telephone Encounter (Signed)
Pt has been notified.

## 2022-02-20 ENCOUNTER — Encounter (INDEPENDENT_AMBULATORY_CARE_PROVIDER_SITE_OTHER): Payer: Self-pay | Admitting: *Deleted

## 2022-02-25 ENCOUNTER — Ambulatory Visit: Payer: Commercial Managed Care - PPO | Admitting: Family Medicine

## 2022-04-17 ENCOUNTER — Encounter: Payer: Self-pay | Admitting: Urology

## 2022-04-17 ENCOUNTER — Ambulatory Visit: Payer: Medicare HMO | Admitting: Urology

## 2022-04-17 VITALS — BP 152/83 | HR 87 | Ht 60.0 in | Wt 154.0 lb

## 2022-04-17 DIAGNOSIS — N3941 Urge incontinence: Secondary | ICD-10-CM

## 2022-04-17 DIAGNOSIS — R3129 Other microscopic hematuria: Secondary | ICD-10-CM

## 2022-04-17 DIAGNOSIS — Z87440 Personal history of urinary (tract) infections: Secondary | ICD-10-CM

## 2022-04-17 LAB — URINALYSIS, ROUTINE W REFLEX MICROSCOPIC
Bilirubin, UA: NEGATIVE
Glucose, UA: NEGATIVE
Ketones, UA: NEGATIVE
Leukocytes,UA: NEGATIVE
Nitrite, UA: NEGATIVE
Protein,UA: NEGATIVE
Specific Gravity, UA: 1.01 (ref 1.005–1.030)
Urobilinogen, Ur: 0.2 mg/dL (ref 0.2–1.0)
pH, UA: 7 (ref 5.0–7.5)

## 2022-04-17 LAB — MICROSCOPIC EXAMINATION
Bacteria, UA: NONE SEEN
Epithelial Cells (non renal): NONE SEEN /hpf (ref 0–10)

## 2022-04-17 MED ORDER — NITROFURANTOIN MONOHYD MACRO 100 MG PO CAPS: 100.0000 mg | ORAL_CAPSULE | Freq: Two times a day (BID) | ORAL | 3 refills | Status: DC

## 2022-04-17 NOTE — Progress Notes (Signed)
Subjective:  1. Personal history of urinary infection   2. Microscopic hematuria   3. Urge incontinence      CC: Frequency and urgency.   Fatuma returns today in f/u. She has a history of recurrent UTI's and keeps macrodantin on hand for breakthroughs but hasn't used it in the last year.  She continues to have urgency on occasional with rare UUI but has no SUI and doesn't require pad.   She has some frequency and isn't sure if it is related to the calcium she is taking for her recent diagnosis of osteoporosis.  She is on actonel as well.  She is also on magnesium.   She has had no hematuria or dysuria. She had a stone in 1989 but none since and no worrisome symptoms.   UA has trace blood as usual Her Cr was 0.89 in 11/23.      ROS:  ROS:  A complete review of systems was performed.  All systems are negative except for pertinent findings as noted.   Review of Systems  Genitourinary:  Positive for frequency and urgency.    Allergies  Allergen Reactions   Bee Venom Hives, Shortness Of Breath and Swelling   Elemental Sulfur Rash   Penicillins Rash    Outpatient Encounter Medications as of 04/17/2022  Medication Sig   cholecalciferol (VITAMIN D) 1000 UNITS tablet Take 2,000 Units by mouth daily.   CRANBERRY EXTRACT PO Take 1 tablet by mouth daily.    EPINEPHrine 0.3 mg/0.3 mL IJ SOAJ injection Inject 0.3 mg into the muscle as needed for anaphylaxis.   fenoprofen (NALFON) 600 MG TABS tablet Take 600 mg by mouth.   Multiple Vitamins-Minerals (HAIR SKIN AND NAILS FORMULA PO) Take by mouth.   Omega 3-6-9 Fatty Acids (OMEGA 3-6-9 COMPLEX PO) Take 1 capsule by mouth daily.   risedronate (ACTONEL) 150 MG tablet Take 1 tablet (150 mg total) by mouth every 30 (thirty) days. with water on empty stomach, nothing by mouth or lie down for next 30 minutes.   [DISCONTINUED] nitrofurantoin, macrocrystal-monohydrate, (MACROBID) 100 MG capsule Take 1 capsule (100 mg total) by mouth every 12 (twelve)  hours.   nitrofurantoin, macrocrystal-monohydrate, (MACROBID) 100 MG capsule Take 1 capsule (100 mg total) by mouth every 12 (twelve) hours.   UNABLE TO FIND Vein & Leg circulation-daily (Patient not taking: Reported on 04/17/2022)   No facility-administered encounter medications on file as of 04/17/2022.    Past Medical History:  Diagnosis Date   Dysplasia of cervix, unspecified 80's   Shingles 12/08/2014   Vaginal Pap smear, abnormal     Past Surgical History:  Procedure Laterality Date   BREAST BIOPSY Left    benign   CATARACT EXTRACTION     COLONOSCOPY N/A 09/15/2012   Procedure: COLONOSCOPY;  Surgeon: Rogene Houston, MD;  Location: [facility];  Service: Endoscopy;  Laterality: N/A;  915   EYE SURGERY     HAMMER TOE SURGERY     LASER ABLATION OF THE CERVIX  1985   laser conization     WRIST SURGERY      Social History   Socioeconomic History   Marital status: Married    Spouse name: Not on file   Number of children: 0   Years of education: Not on file   Highest education level: Not on file  Occupational History   Not on file  Tobacco Use   Smoking status: Never   Smokeless tobacco: Never  Vaping Use  Vaping Use: Never used  Substance and Sexual Activity   Alcohol use: No   Drug use: No   Sexual activity: Yes    Birth control/protection: Post-menopausal  Other Topics Concern   Not on file  Social History Narrative   Not on file   Social Determinants of Health   Financial Resource Strain: Low Risk  (12/23/2021)   Overall Financial Resource Strain (CARDIA)    Difficulty of Paying Living Expenses: Not hard at all  Food Insecurity: No Food Insecurity (12/23/2021)   Hunger Vital Sign    Worried About Running Out of Food in the Last Year: Never true    Ran Out of Food in the Last Year: Never true  Transportation Needs: No Transportation Needs (12/23/2021)   PRAPARE - Hydrologist (Medical): No    Lack of Transportation  (Non-Medical): No  Physical Activity: Sufficiently Active (12/23/2021)   Exercise Vital Sign    Days of Exercise per Week: 5 days    Minutes of Exercise per Session: 40 min  Stress: No Stress Concern Present (12/23/2021)   [place]    Feeling of Stress : Not at all  Social Connections: [place] (12/23/2021)   Social Connection and Isolation Panel [NHANES]    Frequency of Communication with Friends and Family: More than three times a week    Frequency of Social Gatherings with Friends and Family: Three times a week    Attends Religious Services: More than 4 times per year    Active Member of Clubs or Organizations: Yes    Attends Archivist Meetings: 1 to 4 times per year    Marital Status: Married  Human resources officer Violence: Not At Risk (12/23/2021)   Humiliation, Afraid, Rape, and Kick questionnaire    Fear of Current or Ex-Partner: No    Emotionally Abused: No    Physically Abused: No    Sexually Abused: No    Family History  Problem Relation Age of Onset   Hypertension Mother    COPD Father    Cancer Sister 30       cervical cancer   Cancer Maternal Uncle    Cancer Paternal Aunt    COPD Paternal Aunt    Cancer Paternal Uncle    COPD Paternal Uncle        Objective: Vitals:   04/17/22 1017  BP: (!) 152/83  Pulse: 87     Physical Exam  Lab Results:  Results for orders placed or performed in visit on 04/17/22 (from the past 24 hour(s))  Urinalysis, Routine w reflex microscopic     Status: Abnormal   Collection Time: 04/17/22 10:26 AM  Result Value Ref Range   Specific Gravity, UA 1.010 1.005 - 1.030   pH, UA 7.0 5.0 - 7.5   Color, UA Yellow Yellow   Appearance Ur Clear Clear   Leukocytes,UA Negative Negative   Protein,UA Negative Negative/Trace   Glucose, UA Negative Negative   Ketones, UA Negative Negative   RBC, UA Trace (A) Negative   Bilirubin, UA Negative  Negative   Urobilinogen, Ur 0.2 0.2 - 1.0 mg/dL   Nitrite, UA Negative Negative   Microscopic Examination See below:    Narrative   Performed at:  [facility] [address], [place], [place]  [id number] Lab Director: Mina Marble MT, Phone:  [phone]  Microscopic Examination     Status: None   Collection Time:  04/17/22 10:26 AM   Urine  Result Value Ref Range   WBC, UA 0-5 0 - 5 /hpf   RBC, Urine 0-2 0 - 2 /hpf   Epithelial Cells (non renal) None seen 0 - 10 /hpf   Bacteria, UA None seen None seen/Few   Narrative   Performed at:  [facility] [address] [place], [place]  [id number] Lab Director: [place], Phone:  [phone]   UA has tr blood.   EPIC notes and labs reviewed.  Cr noted above.      Assessment & Plan: Recurrent UTI's.  She has done well without symptomatic episode over the past year.   Macrobid refilled to keep on hand.  Microhematuria.  She has only trace blood today.  UUI.   She has minimal UUI that doesn't require treatment.    Meds ordered this encounter  Medications   nitrofurantoin, macrocrystal-monohydrate, (MACROBID) 100 MG capsule    Sig: Take 1 capsule (100 mg total) by mouth every 12 (twelve) hours.    Dispense:  14 capsule    Refill:  3     Orders Placed This Encounter  Procedures   Microscopic Examination   Urinalysis, Routine w reflex microscopic      Return in about 1 year (around 04/17/2023).   CC: Claretta Fraise, MD      Irine Seal 04/18/2022

## 2022-04-24 ENCOUNTER — Ambulatory Visit (INDEPENDENT_AMBULATORY_CARE_PROVIDER_SITE_OTHER): Payer: Medicare HMO | Admitting: Family Medicine

## 2022-04-24 ENCOUNTER — Encounter: Payer: Self-pay | Admitting: Family Medicine

## 2022-04-24 VITALS — BP 140/77 | HR 99 | Temp 97.9°F | Ht 60.0 in | Wt 155.8 lb

## 2022-04-24 DIAGNOSIS — Z0001 Encounter for general adult medical examination with abnormal findings: Secondary | ICD-10-CM

## 2022-04-24 DIAGNOSIS — M810 Age-related osteoporosis without current pathological fracture: Secondary | ICD-10-CM

## 2022-04-24 DIAGNOSIS — E7800 Pure hypercholesterolemia, unspecified: Secondary | ICD-10-CM

## 2022-04-24 DIAGNOSIS — Z0000 Encounter for general adult medical examination without abnormal findings: Secondary | ICD-10-CM

## 2022-04-24 MED ORDER — EPINEPHRINE 0.3 MG/0.3ML IJ SOAJ
0.3000 mg | INTRAMUSCULAR | 1 refills | Status: AC | PRN
Start: 2022-04-24 — End: ?

## 2022-04-24 NOTE — Progress Notes (Signed)
Subjective:  Patient ID: Roberta Barrett, female    DOB: 1955/02/13  Age: 67 y.o. MRN: [id number]  CC: Annual Exam   HPI Roberta Barrett presents for annual exam. Has Gyn. Taking actonel, calcium, Vitamin D 2000 units     04/24/2022    9:12 AM 12/23/2021    2:13 PM 04/24/2021   11:03 AM  Depression screen PHQ 2/9  Decreased Interest 0 0 0  Down, Depressed, Hopeless 0 0 0  PHQ - 2 Score 0 0 0  Altered sleeping  0   Tired, decreased energy  0   Change in appetite  0   Feeling bad or failure about yourself   0   Trouble concentrating  0   Moving slowly or fidgety/restless  0   Suicidal thoughts  0   PHQ-9 Score  0     History Roberta Barrett has a past medical history of Dysplasia of cervix, unspecified (80's), Osteoporosis, Shingles (12/08/2014), and Vaginal Pap smear, abnormal.   Roberta Barrett has a past surgical history that includes laser conization; Laser ablation of the cervix (1985); Colonoscopy (N/A, 09/15/2012); Hammer toe surgery; Breast biopsy (Left); Wrist surgery; Eye surgery; and Cataract extraction.   Her family history includes COPD in her father, paternal aunt, and paternal uncle; Cancer in her maternal uncle, paternal aunt, and paternal uncle; Cancer (age of onset: 84) in her sister; Hypertension in her mother.Roberta Barrett reports that Roberta Barrett has never smoked. Roberta Barrett has never used smokeless tobacco. Roberta Barrett reports that Roberta Barrett does not drink alcohol and does not use drugs.    ROS Review of Systems  Constitutional:  Negative for appetite change, chills, diaphoresis, fatigue, fever and unexpected weight change.  HENT:  Negative for congestion, ear pain, hearing loss, postnasal drip, rhinorrhea, sneezing, sore throat and trouble swallowing.   Eyes:  Negative for pain.  Respiratory:  Negative for cough, chest tightness and shortness of breath.   Cardiovascular:  Negative for chest pain and palpitations.  Gastrointestinal:  Negative for abdominal pain, constipation, diarrhea, nausea and vomiting.   Endocrine: Negative for cold intolerance, heat intolerance, polydipsia, polyphagia and polyuria.  Genitourinary:  Negative for dysuria, frequency and menstrual problem.  Musculoskeletal:  Negative for arthralgias and joint swelling.  Skin:  Negative for rash.  Allergic/Immunologic: Negative for environmental allergies.  Neurological:  Negative for dizziness, weakness, numbness and headaches.  Psychiatric/Behavioral:  Negative for agitation and dysphoric mood.     Objective:  BP (!) 140/77   Pulse 99   Temp 97.9 F (36.6 C)   Ht 5' (1.524 m)   Wt 155 lb 12.8 oz (70.7 kg)   SpO2 100%   BMI 30.43 kg/m   BP Readings from Last 3 Encounters:  04/24/22 (!) 140/77  04/17/22 (!) 152/83  12/23/21 (!) 178/86    Wt Readings from Last 3 Encounters:  04/24/22 155 lb 12.8 oz (70.7 kg)  04/17/22 154 lb (69.9 kg)  12/23/21 154 lb 4 oz (70 kg)     Physical Exam Constitutional:      General: Roberta Barrett is not in acute distress.    Appearance: Roberta Barrett is well-developed.  HENT:     Head: Normocephalic and atraumatic.  Eyes:     Conjunctiva/sclera: Conjunctivae normal.     Pupils: Pupils are equal, round, and reactive to light.  Neck:     Thyroid: No thyromegaly.  Cardiovascular:     Rate and Rhythm: Normal rate and regular rhythm.     Heart sounds: Normal heart sounds. No murmur heard.  Pulmonary:     Effort: Pulmonary effort is normal. No respiratory distress.     Breath sounds: Normal breath sounds. No wheezing or rales.  Abdominal:     General: Bowel sounds are normal. There is no distension.     Palpations: Abdomen is soft.     Tenderness: There is no abdominal tenderness.  Musculoskeletal:        General: Normal range of motion.     Cervical back: Normal range of motion and neck supple.  Lymphadenopathy:     Cervical: No cervical adenopathy.  Skin:    General: Skin is warm and dry.  Neurological:     Mental Status: Roberta Barrett is alert and oriented to person, place, and time.   Psychiatric:        Behavior: Behavior normal.        Thought Content: Thought content normal.        Judgment: Judgment normal.       Assessment & Plan:   Roberta Barrett was seen today for annual exam.  Diagnoses and all orders for this visit:  Well adult exam -     CBC with Differential/Platelet -     CMP14+EGFR -     Lipid panel -     VITAMIN D 25 Hydroxy (Vit-D Deficiency, Fractures) -     Urinalysis  Elevated cholesterol -     Lipid panel  Age-related osteoporosis without current pathological fracture -     Magnesium  Other orders -     EPINEPHrine 0.3 mg/0.3 mL IJ SOAJ injection; Inject 0.3 mg into the muscle as needed for anaphylaxis.       I have discontinued Maimouna P. Rigdon's CRANBERRY EXTRACT PO, UNABLE TO FIND, fenoprofen, and nitrofurantoin (macrocrystal-monohydrate). I am also having her maintain her cholecalciferol, Omega 3-6-9 Fatty Acids (OMEGA 3-6-9 COMPLEX PO), Multiple Vitamins-Minerals (HAIR SKIN AND NAILS FORMULA PO), risedronate, Calcium Carb-Cholecalciferol (CALCIUM 600 + D PO), Magnesium, and EPINEPHrine.  Allergies as of 04/24/2022       Reactions   Bee Venom Hives, Shortness Of Breath, Swelling   Elemental Sulfur Rash   Penicillins Rash        Medication List        Accurate as of April 24, 2022  9:57 AM. If you have any questions, ask your nurse or doctor.          STOP taking these medications    CRANBERRY EXTRACT PO Stopped by: Claretta Fraise, MD   fenoprofen 600 MG Tabs tablet Commonly known as: NALFON Stopped by: Claretta Fraise, MD   nitrofurantoin (macrocrystal-monohydrate) 100 MG capsule Commonly known as: MACROBID Stopped by: Claretta Fraise, MD   UNABLE TO FIND Stopped by: Claretta Fraise, MD       TAKE these medications    CALCIUM 600 + D PO Take by mouth.   cholecalciferol 1000 units tablet Commonly known as: VITAMIN D Take 2,000 Units by mouth daily.   EPINEPHrine 0.3 mg/0.3 mL Soaj injection Commonly  known as: EPI-PEN Inject 0.3 mg into the muscle as needed for anaphylaxis.   HAIR SKIN AND NAILS FORMULA PO Take by mouth.   Magnesium 250 MG Tabs Take by mouth.   OMEGA 3-6-9 COMPLEX PO Take 1 capsule by mouth daily.   risedronate 150 MG tablet Commonly known as: Actonel Take 1 tablet (150 mg total) by mouth every 30 (thirty) days. with water on empty stomach, nothing by mouth or lie down for next 30 minutes.  Follow-up: Return in about 1 year (around 04/24/2023).  Claretta Fraise, M.D.

## 2022-04-25 LAB — CMP14+EGFR
ALT: 24 IU/L (ref 0–32)
AST: 24 IU/L (ref 0–40)
Albumin/Globulin Ratio: 1.6 (ref 1.2–2.2)
Albumin: 4.7 g/dL (ref 3.9–4.9)
Alkaline Phosphatase: 65 IU/L (ref 44–121)
BUN/Creatinine Ratio: 18 (ref 12–28)
BUN: 18 mg/dL (ref 8–27)
Bilirubin Total: 0.6 mg/dL (ref 0.0–1.2)
CO2: 22 mmol/L (ref 20–29)
Calcium: 9.3 mg/dL (ref 8.7–10.3)
Chloride: 102 mmol/L (ref 96–106)
Creatinine, Ser: 0.99 mg/dL (ref 0.57–1.00)
Globulin, Total: 2.9 g/dL (ref 1.5–4.5)
Glucose: 92 mg/dL (ref 70–99)
Potassium: 4.1 mmol/L (ref 3.5–5.2)
Sodium: 140 mmol/L (ref 134–144)
Total Protein: 7.6 g/dL (ref 6.0–8.5)
eGFR: 63 mL/min/{1.73_m2} (ref >59)

## 2022-04-25 LAB — CBC WITH DIFFERENTIAL/PLATELET
Basophils Absolute: 0.1 10*3/uL (ref 0.0–0.2)
Basos: 1 %
EOS (ABSOLUTE): 0.1 10*3/uL (ref 0.0–0.4)
Eos: 3 %
Hematocrit: 41.4 % (ref 34.0–46.6)
Hemoglobin: 13.4 g/dL (ref 11.1–15.9)
Immature Grans (Abs): 0 10*3/uL (ref 0.0–0.1)
Immature Granulocytes: 1 %
Lymphocytes Absolute: 1.6 10*3/uL (ref 0.7–3.1)
Lymphs: 36 %
MCH: 27.3 pg (ref 26.6–33.0)
MCHC: 32.4 g/dL (ref 31.5–35.7)
MCV: 85 fL (ref 79–97)
Monocytes Absolute: 0.3 10*3/uL (ref 0.1–0.9)
Monocytes: 8 %
Neutrophils Absolute: 2.2 10*3/uL (ref 1.4–7.0)
Neutrophils: 51 %
Platelets: 144 10*3/uL — ABNORMAL LOW (ref 150–450)
RBC: 4.9 x10E6/uL (ref 3.77–5.28)
RDW: 13.5 % (ref 11.7–15.4)
WBC: 4.3 10*3/uL (ref 3.4–10.8)

## 2022-04-25 LAB — LIPID PANEL
Chol/HDL Ratio: 3.2 ratio (ref 0.0–4.4)
Cholesterol, Total: 212 mg/dL — ABNORMAL HIGH (ref 100–199)
HDL: 66 mg/dL (ref >39)
LDL Chol Calc (NIH): 133 mg/dL — ABNORMAL HIGH (ref 0–99)
Triglycerides: 74 mg/dL (ref 0–149)
VLDL Cholesterol Cal: 13 mg/dL (ref 5–40)

## 2022-04-25 LAB — MAGNESIUM: Magnesium: 2.2 mg/dL (ref 1.6–2.3)

## 2022-04-25 LAB — VITAMIN D 25 HYDROXY (VIT D DEFICIENCY, FRACTURES): Vit D, 25-Hydroxy: 46.8 ng/mL (ref 30.0–100.0)

## 2022-05-01 ENCOUNTER — Telehealth (INDEPENDENT_AMBULATORY_CARE_PROVIDER_SITE_OTHER): Payer: Self-pay | Admitting: Gastroenterology

## 2022-05-01 NOTE — Telephone Encounter (Signed)
Left message to return call 

## 2022-05-01 NOTE — Telephone Encounter (Signed)
Who is your primary care physician: Dr.warren Stacks, [place]  Reasons for the colonoscopy: Recall  Have you had a colonoscopy before?  Yes Sep 15 2012  Do you have family history of colon cancer? Yes; mother (DOD 01/02/21 Dementia and colon cancer 66 y/o)  Previous colonoscopy with polyps removed? No  Do you have a history colorectal cancer?   No  Are you diabetic? If yes, Type 1 or Type 2?    No  Do you have a prosthetic or mechanical heart valve? No  Do you have a pacemaker/defibrillator?   No  Have you had endocarditis/atrial fibrillation? No  Have you had joint replacement within the last 12 months?  No  Do you tend to be constipated or have to use laxatives? No  Do you have any history of drugs or alchohol?  No  Do you use supplemental oxygen?  No  Have you had a stroke or heart attack within the last 6 months?No  Do you take weight loss medication? No  For female patients: have you had a hysterectomy?  No                                     are you post menopausal?       Yes                                            do you still have your menstrual cycle? No      Do you take any blood-thinning medications such as: (aspirin, warfarin, Plavix, Aggrenox)  no  If yes we need the name, milligram, dosage and who is prescribing doctor  Current Outpatient Medications on File Prior to Visit  Medication Sig Dispense Refill   EPINEPHrine 0.3 mg/0.3 mL IJ SOAJ injection Inject 0.3 mg into the muscle as needed for anaphylaxis. 1 each 1   risedronate (ACTONEL) 150 MG tablet Take 1 tablet (150 mg total) by mouth every 30 (thirty) days. with water on empty stomach, nothing by mouth or lie down for next 30 minutes. 1 tablet 12   No current facility-administered medications on file prior to visit.    Allergies  Allergen Reactions   Bee Venom Hives, Shortness Of Breath and Swelling   Elemental Sulfur Rash   Penicillins Rash     Pharmacy: [facility]  Primary  Insurance Name: [employer] number where you can be reached: [phone]

## 2022-05-02 NOTE — Telephone Encounter (Signed)
Pt contacted and states she is not due until August. Recall date is 09/2022. Pt would like a call back later this summer when August schedule is available.

## 2022-05-05 ENCOUNTER — Encounter (HOSPITAL_COMMUNITY): Payer: Self-pay

## 2022-05-05 ENCOUNTER — Ambulatory Visit (HOSPITAL_COMMUNITY)
Admission: RE | Admit: 2022-05-05 | Discharge: 2022-05-05 | Disposition: A | Discharge status: Home / Self Care | Payer: Medicare HMO | Source: Ambulatory Visit | Attending: Adult Health | Admitting: Adult Health

## 2022-05-05 DIAGNOSIS — Z1231 Encounter for screening mammogram for malignant neoplasm of breast: Secondary | ICD-10-CM | POA: Insufficient documentation

## 2022-05-06 ENCOUNTER — Telehealth: Payer: Self-pay | Admitting: Adult Health

## 2022-05-06 NOTE — Telephone Encounter (Signed)
Pt aware of mammogram, will get follow up scheduled

## 2022-05-07 ENCOUNTER — Other Ambulatory Visit (HOSPITAL_COMMUNITY): Payer: Self-pay | Admitting: Adult Health

## 2022-05-07 DIAGNOSIS — R928 Other abnormal and inconclusive findings on diagnostic imaging of breast: Secondary | ICD-10-CM

## 2022-05-13 ENCOUNTER — Encounter (HOSPITAL_COMMUNITY): Payer: Self-pay

## 2022-05-13 ENCOUNTER — Ambulatory Visit (HOSPITAL_COMMUNITY)
Admission: RE | Admit: 2022-05-13 | Discharge: 2022-05-13 | Disposition: A | Discharge status: Home / Self Care | Payer: Medicare HMO | Source: Ambulatory Visit | Attending: Adult Health | Admitting: Adult Health

## 2022-05-13 DIAGNOSIS — R928 Other abnormal and inconclusive findings on diagnostic imaging of breast: Secondary | ICD-10-CM

## 2022-06-12 ENCOUNTER — Telehealth: Payer: Self-pay | Admitting: Family Medicine

## 2022-06-12 NOTE — Telephone Encounter (Signed)
Contacted Jessamy P Arrey to schedule their annual wellness visit. Appointment made for 06/26/2022.  [facility] Care Guide [facility] AWV TEAM Direct Dial: [phone]

## 2022-06-26 ENCOUNTER — Ambulatory Visit (INDEPENDENT_AMBULATORY_CARE_PROVIDER_SITE_OTHER): Payer: Medicare HMO

## 2022-06-26 VITALS — Ht 60.0 in | Wt 153.0 lb

## 2022-06-26 DIAGNOSIS — Z0000 Encounter for general adult medical examination without abnormal findings: Secondary | ICD-10-CM

## 2022-06-26 NOTE — Progress Notes (Signed)
Subjective:   SHONTAL HIRN is a 67 y.o. female who presents for an Initial Medicare Annual Wellness Visit. I connected with  Loray Maples Symes on 06/26/22 by a audio enabled telemedicine application and verified that I am speaking with the correct person using two identifiers.  Patient Location: Home  Provider Location: Home Office  I discussed the limitations of evaluation and management by telemedicine. The patient expressed understanding and agreed to proceed.  Review of Systems     Cardiac Risk Factors include: advanced age (>69men, >23 women)     Objective:    Today's Vitals   06/26/22 0918  Weight: 153 lb (69.4 kg)  Height: 5' (1.524 m)   Body mass index is 29.88 kg/m.     06/26/2022    9:21 AM 09/15/2012    8:15 AM  Advanced Directives  Does Patient Have a Medical Advance Directive? No Patient would like information  Would patient like information on creating a medical advance directive? No - Patient declined Advance directive packet given    Current Medications (verified) Outpatient Encounter Medications as of 06/26/2022  Medication Sig   EPINEPHrine 0.3 mg/0.3 mL IJ SOAJ injection Inject 0.3 mg into the muscle as needed for anaphylaxis.   risedronate (ACTONEL) 150 MG tablet Take 1 tablet (150 mg total) by mouth every 30 (thirty) days. with water on empty stomach, nothing by mouth or lie down for next 30 minutes.   No facility-administered encounter medications on file as of 06/26/2022.    Allergies (verified) Bee venom, Elemental sulfur, and Penicillins   History: Past Medical History:  Diagnosis Date   Dysplasia of cervix, unspecified 80's   Osteoporosis    Shingles 12/08/2014   Vaginal Pap smear, abnormal    Past Surgical History:  Procedure Laterality Date   BREAST BIOPSY Left    benign   CATARACT EXTRACTION     COLONOSCOPY N/A 09/15/2012   Procedure: COLONOSCOPY;  Surgeon: Malissa Hippo, MD;  Location: [facility];  Service: Endoscopy;   Laterality: N/A;  915   EYE SURGERY     HAMMER TOE SURGERY     LASER ABLATION OF THE CERVIX  1985   laser conization     WRIST SURGERY     Family History  Problem Relation Age of Onset   Hypertension Mother    COPD Father    Cancer Sister 62       cervical cancer   Cancer Maternal Uncle    Cancer Paternal Aunt    COPD Paternal Aunt    Cancer Paternal Uncle    COPD Paternal Uncle    Social History   Socioeconomic History   Marital status: Married    Spouse name: Not on file   Number of children: 0   Years of education: Not on file   Highest education level: Not on file  Occupational History   Not on file  Tobacco Use   Smoking status: Never   Smokeless tobacco: Never  Vaping Use   Vaping Use: Never used  Substance and Sexual Activity   Alcohol use: No   Drug use: No   Sexual activity: Yes    Birth control/protection: Post-menopausal  Other Topics Concern   Not on file  Social History Narrative   Not on file   Social Determinants of Health   Financial Resource Strain: Low Risk  (06/26/2022)   Overall Financial Resource Strain (CARDIA)    Difficulty of Paying Living Expenses: Not hard at all  Food Insecurity: No Food Insecurity (06/26/2022)   Hunger Vital Sign    Worried About Running Out of Food in the Last Year: Never true    Ran Out of Food in the Last Year: Never true  Transportation Needs: No Transportation Needs (06/26/2022)   PRAPARE - Administrator, Civil Service (Medical): No    Lack of Transportation (Non-Medical): No  Physical Activity: Sufficiently Active (06/26/2022)   Exercise Vital Sign    Days of Exercise per Week: 5 days    Minutes of Exercise per Session: 30 min  Stress: No Stress Concern Present (06/26/2022)   [employer] of Occupational Health - Occupational Stress Questionnaire    Feeling of Stress : Not at all  Social Connections: Moderately Integrated (06/26/2022)   Social Connection and Isolation Panel [NHANES]     Frequency of Communication with Friends and Family: More than three times a week    Frequency of Social Gatherings with Friends and Family: More than three times a week    Attends Religious Services: More than 4 times per year    Active Member of [employer] or Organizations: No    Attends Engineer, structural: Never    Marital Status: Married    Tobacco Counseling Counseling given: Not Answered   Clinical Intake:  Pre-visit preparation completed: Yes  Pain : No/denies pain     Nutritional Risks: None Diabetes: No  How often do you need to have someone help you when you read instructions, pamphlets, or other written materials from your doctor or pharmacy?: 1 - Never  Diabetic?no   Interpreter Needed?: No  Information entered by :: Renie Ora, LPN   Activities of Daily Living    06/26/2022    9:21 AM 06/23/2022    4:37 PM  In your present state of health, do you have any difficulty performing the following activities:  Hearing? 0 0  Vision? 0 0  Difficulty concentrating or making decisions? 0 0  Walking or climbing stairs? 0 0  Dressing or bathing? 0 0  Doing errands, shopping? 0 0  Preparing Food and eating ? N N  Using the Toilet? N N  In the past six months, have you accidently leaked urine? N Y  Do you have problems with loss of bowel control? N N  Managing your Medications? N N  Managing your Finances? N N  Housekeeping or managing your Housekeeping? N N    Patient Care Team: Mechele Claude, MD as PCP - General (Family Medicine)  Indicate any recent Medical Services you may have received from other than [facility] providers in the past year (date may be approximate).     Assessment:   This is a routine wellness examination for Angely.  Hearing/Vision screen Vision Screening - Comments:: Wears rx glasses - up to date with routine eye exams with  Dr.Groat   Dietary issues and exercise activities discussed: Current Exercise Habits: Home exercise routine,  Type of exercise: walking, Time (Minutes): 30, Frequency (Times/Week): 5, Weekly Exercise (Minutes/Week): 150, Intensity: Mild, Exercise limited by: None identified   Goals Addressed             This Visit's Progress    DIET - INCREASE WATER INTAKE         Depression Screen    06/26/2022    9:20 AM 04/24/2022    9:12 AM 12/23/2021    2:13 PM 04/24/2021   11:03 AM 04/24/2021   10:58 AM 12/05/2020   10:50 AM  12/05/2019   10:54 AM  PHQ 2/9 Scores  PHQ - 2 Score 0 0 0 0 0 0 0  PHQ- 9 Score   0   0 0    Fall Risk    06/26/2022    9:19 AM 06/23/2022    4:37 PM 04/24/2022    9:12 AM 12/23/2021    2:23 PM 04/24/2021   11:03 AM  Fall Risk   Falls in the past year? 0 0 0 0 1  Number falls in past yr: 0 0  0 0  Injury with Fall? 0 0  0 1  Risk for fall due to : No Fall Risks   No Fall Risks History of fall(s)  Follow up Falls prevention discussed    Falls evaluation completed    FALL RISK PREVENTION PERTAINING TO THE HOME:  Any stairs in or around the home? Yes  If so, are there any without handrails? No  Home free of loose throw rugs in walkways, pet beds, electrical cords, etc? Yes  Adequate lighting in your home to reduce risk of falls? Yes   ASSISTIVE DEVICES UTILIZED TO PREVENT FALLS:  Life alert? No  Use of a cane, walker or w/c? No  Grab bars in the bathroom? Yes  Shower chair or bench in shower? Yes  Elevated toilet seat or a handicapped toilet? Yes           06/26/2022    9:22 AM  6CIT Screen  What Year? 0 points  What month? 0 points  What time? 0 points  Count back from 20 0 points  Months in reverse 0 points  Repeat phrase 0 points  Total Score 0 points    Immunizations Immunization History  Administered Date(s) Administered   Influenza,inj,Quad PF,6+ Mos 02/12/2017   Moderna Sars-Covid-2 Vaccination 06/09/2019, 07/07/2019   Tdap 09/16/2018   Zoster Recombinat (Shingrix) 09/23/2017, 12/01/2017    TDAP status: Up to date  Flu Vaccine status:  Declined, Education has been provided regarding the importance of this vaccine but patient still declined. Advised may receive this vaccine at local pharmacy or Health Dept. Aware to provide a copy of the vaccination record if obtained from local pharmacy or Health Dept. Verbalized acceptance and understanding.  Pneumococcal vaccine status: Due, Education has been provided regarding the importance of this vaccine. Advised may receive this vaccine at local pharmacy or Health Dept. Aware to provide a copy of the vaccination record if obtained from local pharmacy or Health Dept. Verbalized acceptance and understanding.  Covid-19 vaccine status: Completed vaccines  Qualifies for Shingles Vaccine? Yes   Zostavax completed Yes   Shingrix Completed?: Yes  Screening Tests Health Maintenance  Topic Date Due   COVID-19 Vaccine (3 - 2023-24 season) 10/11/2021   Pneumonia Vaccine 26+ Years old (1 of 1 - PCV) 04/24/2023 (Originally 07/26/2021)   INFLUENZA VACCINE  09/11/2022   COLONOSCOPY (Pts 45-39yrs Insurance coverage will need to be confirmed)  09/16/2022   MAMMOGRAM  05/05/2023   Medicare Annual Wellness (AWV)  06/26/2023   PAP SMEAR-Modifier  12/23/2024   DTaP/Tdap/Td (2 - Td or Tdap) 09/15/2028   DEXA SCAN  Completed   Hepatitis C Screening  Completed   HIV Screening  Completed   Zoster Vaccines- Shingrix  Completed   HPV VACCINES  Aged Out    Health Maintenance  Health Maintenance Due  Topic Date Due   COVID-19 Vaccine (3 - 2023-24 season) 10/11/2021    Colorectal cancer screening: Type of screening: Colonoscopy.  Completed 09/15/2012. Repeat every 10 years  Mammogram status: Completed 05/05/2022. Repeat every year  Bone Density status: Completed 01/07/2022. Results reflect: Bone density results: OSTEOPENIA. Repeat every 5 years.  Lung Cancer Screening: (Low Dose CT Chest recommended if Age 65-80 years, 30 pack-year currently smoking OR have quit w/in 15years.) does not qualify.    Lung Cancer Screening Referral: n/a  Additional Screening:  Hepatitis C Screening: does not qualify;   Vision Screening: Recommended annual ophthalmology exams for early detection of glaucoma and other disorders of the eye. Is the patient up to date with their annual eye exam?  Yes  Who is the provider or what is the name of the office in which the patient attends annual eye exams? Dr.Groat  If pt is not established with a provider, would they like to be referred to a provider to establish care? No .   Dental Screening: Recommended annual dental exams for proper oral hygiene  Community Resource Referral / Chronic Care Management: CRR required this visit?  No   CCM required this visit?  No      Plan:     I have personally reviewed and noted the following in the patient's chart:   Medical and social history Use of alcohol, tobacco or illicit drugs  Current medications and supplements including opioid prescriptions. Patient is not currently taking opioid prescriptions. Functional ability and status Nutritional status Physical activity Advanced directives List of other physicians Hospitalizations, surgeries, and ER visits in previous 12 months Vitals Screenings to include cognitive, depression, and falls Referrals and appointments  In addition, I have reviewed and discussed with patient certain preventive protocols, quality metrics, and best practice recommendations. A written personalized care plan for preventive services as well as general preventive health recommendations were provided to patient.     Lorrene Reid, LPN   10/09/5619   Nurse Notes: none

## 2022-06-26 NOTE — Patient Instructions (Signed)
[name] , Thank you for taking time to come for your Medicare Wellness Visit. I appreciate your ongoing commitment to your health goals. Please review the following plan we discussed and let me know if I can assist you in the future.   These are the goals we discussed:  Goals      DIET - INCREASE WATER INTAKE        This is a list of the screening recommended for you and due dates:  Health Maintenance  Topic Date Due   COVID-19 Vaccine (3 - 2023-24 season) 10/11/2021   Pneumonia Vaccine (1 of 1 - PCV) 04/24/2023*   Flu Shot  09/11/2022   Colon Cancer Screening  09/16/2022   Mammogram  05/05/2023   Medicare Annual Wellness Visit  06/26/2023   Pap Smear  12/23/2024   DTaP/Tdap/Td vaccine (2 - Td or Tdap) 09/15/2028   DEXA scan (bone density measurement)  Completed   Hepatitis C Screening: USPSTF Recommendation to screen - Ages 56-79 yo.  Completed   HIV Screening  Completed   Zoster (Shingles) Vaccine  Completed   HPV Vaccine  Aged Out  *Topic was postponed. The date shown is not the original due date.    Advanced directives: Advance directive discussed with you today. I have provided a copy for you to complete at home and have notarized. Once this is complete please bring a copy in to our office so we can scan it into your chart.   Conditions/risks identified: Aim for 30 minutes of exercise or brisk walking, 6-8 glasses of water, and 5 servings of fruits and vegetables each day.   Next appointment: Follow up in one year for your annual wellness visit    Preventive Care 65 Years and Older, Female Preventive care refers to lifestyle choices and visits with your health care provider that can promote health and wellness. What does preventive care include? A yearly physical exam. This is also called an annual well check. Dental exams once or twice a year. Routine eye exams. Ask your health care provider how often you should have your eyes checked. Personal lifestyle choices,  including: Daily care of your teeth and gums. Regular physical activity. Eating a healthy diet. Avoiding tobacco and drug use. Limiting alcohol use. Practicing safe sex. Taking low-dose aspirin every day. Taking vitamin and mineral supplements as recommended by your health care provider. What happens during an annual well check? The services and screenings done by your health care provider during your annual well check will depend on your age, overall health, lifestyle risk factors, and family history of disease. Counseling  Your health care provider may ask you questions about your: Alcohol use. Tobacco use. Drug use. Emotional well-being. Home and relationship well-being. Sexual activity. Eating habits. History of falls. Memory and ability to understand (cognition). Work and work Astronomer. Reproductive health. Screening  You may have the following tests or measurements: Height, weight, and BMI. Blood pressure. Lipid and cholesterol levels. These may be checked every 5 years, or more frequently if you are over 29 years old. Skin check. Lung cancer screening. You may have this screening every year starting at age 64 if you have a 30-pack-year history of smoking and currently smoke or have quit within the past 15 years. Fecal occult blood test (FOBT) of the stool. You may have this test every year starting at age 3. Flexible sigmoidoscopy or colonoscopy. You may have a sigmoidoscopy every 5 years or a colonoscopy every 10 years starting at  age 52. Hepatitis C blood test. Hepatitis B blood test. Sexually transmitted disease (STD) testing. Diabetes screening. This is done by checking your blood sugar (glucose) after you have not eaten for a while (fasting). You may have this done every 1-3 years. Bone density scan. This is done to screen for osteoporosis. You may have this done starting at age 26. Mammogram. This may be done every 1-2 years. Talk to your health care provider  about how often you should have regular mammograms. Talk with your health care provider about your test results, treatment options, and if necessary, the need for more tests. Vaccines  Your health care provider may recommend certain vaccines, such as: Influenza vaccine. This is recommended every year. Tetanus, diphtheria, and acellular pertussis (Tdap, Td) vaccine. You may need a Td booster every 10 years. Zoster vaccine. You may need this after age 54. Pneumococcal 13-valent conjugate (PCV13) vaccine. One dose is recommended after age 56. Pneumococcal polysaccharide (PPSV23) vaccine. One dose is recommended after age 86. Talk to your health care provider about which screenings and vaccines you need and how often you need them. This information is not intended to replace advice given to you by your health care provider. Make sure you discuss any questions you have with your health care provider. Document Released: 02/23/2015 Document Revised: 10/17/2015 Document Reviewed: 11/28/2014 Elsevier Interactive Patient Education  2017 [place] Prevention in the Home Falls can cause injuries. They can happen to people of all ages. There are many things you can do to make your home safe and to help prevent falls. What can I do on the outside of my home? Regularly fix the edges of walkways and driveways and fix any cracks. Remove anything that might make you trip as you walk through a door, such as a raised step or threshold. Trim any bushes or trees on the path to your home. Use bright outdoor lighting. Clear any walking paths of anything that might make someone trip, such as rocks or tools. Regularly check to see if handrails are loose or broken. Make sure that both sides of any steps have handrails. Any raised decks and porches should have guardrails on the edges. Have any leaves, snow, or ice cleared regularly. Use sand or salt on walking paths during winter. Clean up any spills in  your garage right away. This includes oil or grease spills. What can I do in the bathroom? Use night lights. Install grab bars by the toilet and in the tub and shower. Do not use towel bars as grab bars. Use non-skid mats or decals in the tub or shower. If you need to sit down in the shower, use a plastic, non-slip stool. Keep the floor dry. Clean up any water that spills on the floor as soon as it happens. Remove soap buildup in the tub or shower regularly. Attach bath mats securely with double-sided non-slip rug tape. Do not have throw rugs and other things on the floor that can make you trip. What can I do in the bedroom? Use night lights. Make sure that you have a light by your bed that is easy to reach. Do not use any sheets or blankets that are too big for your bed. They should not hang down onto the floor. Have a firm chair that has side arms. You can use this for support while you get dressed. Do not have throw rugs and other things on the floor that can make you trip. What can I  do in the kitchen? Clean up any spills right away. Avoid walking on wet floors. Keep items that you use a lot in easy-to-reach places. If you need to reach something above you, use a strong step stool that has a grab bar. Keep electrical cords out of the way. Do not use floor polish or wax that makes floors slippery. If you must use wax, use non-skid floor wax. Do not have throw rugs and other things on the floor that can make you trip. What can I do with my stairs? Do not leave any items on the stairs. Make sure that there are handrails on both sides of the stairs and use them. Fix handrails that are broken or loose. Make sure that handrails are as long as the stairways. Check any carpeting to make sure that it is firmly attached to the stairs. Fix any carpet that is loose or worn. Avoid having throw rugs at the top or bottom of the stairs. If you do have throw rugs, attach them to the floor with carpet  tape. Make sure that you have a light switch at the top of the stairs and the bottom of the stairs. If you do not have them, ask someone to add them for you. What else can I do to help prevent falls? Wear shoes that: Do not have high heels. Have rubber bottoms. Are comfortable and fit you well. Are closed at the toe. Do not wear sandals. If you use a stepladder: Make sure that it is fully opened. Do not climb a closed stepladder. Make sure that both sides of the stepladder are locked into place. Ask someone to hold it for you, if possible. Clearly mark and make sure that you can see: Any grab bars or handrails. First and last steps. Where the edge of each step is. Use tools that help you move around (mobility aids) if they are needed. These include: Canes. Walkers. Scooters. Crutches. Turn on the lights when you go into a dark area. Replace any light bulbs as soon as they burn out. Set up your furniture so you have a clear path. Avoid moving your furniture around. If any of your floors are uneven, fix them. If there are any pets around you, be aware of where they are. Review your medicines with your doctor. Some medicines can make you feel dizzy. This can increase your chance of falling. Ask your doctor what other things that you can do to help prevent falls. This information is not intended to replace advice given to you by your health care provider. Make sure you discuss any questions you have with your health care provider. Document Released: 11/23/2008 Document Revised: 07/05/2015 Document Reviewed: 03/03/2014 Elsevier Interactive Patient Education  2017 [employer].

## 2022-07-15 ENCOUNTER — Encounter: Payer: Self-pay | Admitting: Adult Health

## 2022-07-15 ENCOUNTER — Ambulatory Visit: Payer: Medicare HMO | Admitting: Adult Health

## 2022-07-15 VITALS — BP 161/91 | HR 76 | Ht 60.0 in | Wt 157.0 lb

## 2022-07-15 DIAGNOSIS — R030 Elevated blood-pressure reading, without diagnosis of hypertension: Secondary | ICD-10-CM | POA: Diagnosis not present

## 2022-07-15 DIAGNOSIS — N952 Postmenopausal atrophic vaginitis: Secondary | ICD-10-CM | POA: Diagnosis not present

## 2022-07-15 DIAGNOSIS — N950 Postmenopausal bleeding: Secondary | ICD-10-CM | POA: Diagnosis not present

## 2022-07-15 NOTE — Progress Notes (Addendum)
  Subjective:     Patient ID: Roberta Barrett, female   DOB: 1955-10-08, 67 y.o.   MRN: [id number]  HPI Roberta Barrett is a 67 year old white female, married, PM in complaining of having vaginal spotting 2 x Saturday morning, had been at beach doing a lot of walking.  Last pap was NILM + HPV 12/23/21.  PCP is Dr Darlyn Read  Review of Systems Vaginal spotting x 2 Saturday Reviewed past medical,surgical, social and family history. Reviewed medications and allergies.     Objective:   Physical Exam BP (!) 182/98 (BP Location: Right Arm, Patient Position: Sitting, Cuff Size: Normal)   Pulse 80   Ht 5' (1.524 m)   Wt 157 lb (71.2 kg)   BMI 30.66 kg/m     Skin warm and dry.Pelvic: external genitalia is normal in appearance no lesions, vagina: pale and atrophic tissue,urethra has no lesions or masses noted, cervix:smooth and atrophic, uterus: normal size, shape and contour, non tender, no masses felt, adnexa: no masses or tenderness noted. Bladder is non tender and no masses felt.  Fall risk is low  Upstream - 07/15/22 0955       Pregnancy Intention Screening   Does the patient want to become pregnant in the next year? N/A    Does the patient's partner want to become pregnant in the next year? N/A    Would the patient like to discuss contraceptive options today? N/A      Contraception Wrap Up   Current Method Abstinence    End Method Abstinence    Contraception Counseling Provided No            Examination chaperoned by Erick Alley DO Assessment:     1. PMB (postmenopausal bleeding) Vaginal spotting  x 2 Saturday  - US PELVIC COMPLETE WITH TRANSVAGINAL; Future She is aware if endometrium thickened will needed endometrium biopsy  2. Vaginal atrophy    3. Elevated BP  Keep check on BP at home  Plan:     Will talk when results back Follow up prn

## 2022-07-22 ENCOUNTER — Ambulatory Visit (HOSPITAL_COMMUNITY)
Admission: RE | Admit: 2022-07-22 | Discharge: 2022-07-22 | Disposition: A | Discharge status: Home / Self Care | Payer: Medicare HMO | Source: Ambulatory Visit | Attending: Adult Health | Admitting: Adult Health

## 2022-07-22 DIAGNOSIS — N950 Postmenopausal bleeding: Secondary | ICD-10-CM | POA: Insufficient documentation

## 2022-09-01 ENCOUNTER — Encounter (INDEPENDENT_AMBULATORY_CARE_PROVIDER_SITE_OTHER): Payer: Self-pay | Admitting: *Deleted

## 2022-09-04 MED ORDER — PEG 3350-KCL-NA BICARB-NACL 420 G PO SOLR: 4000.0000 mL | Freq: Once | ORAL | 0 refills | Status: AC

## 2022-09-04 NOTE — Telephone Encounter (Signed)
PT CALLED BACK. She had to change date. She has been rescheduled to 8/29 at 730am.

## 2022-09-04 NOTE — Addendum Note (Signed)
Addended by: Armstead Peaks on: 09/04/2022 01:28 PM   Modules accepted: Orders

## 2022-09-04 NOTE — Telephone Encounter (Signed)
SPOKE WITH PT. She has been scheduled for 8/7. Aware will send instructions via mychart. Rx for prep sent to pharmacy.

## 2022-09-23 ENCOUNTER — Encounter: Payer: Self-pay | Admitting: *Deleted

## 2022-10-09 ENCOUNTER — Encounter (HOSPITAL_COMMUNITY)
Admission: RE | Disposition: A | Discharge status: Home / Self Care | Payer: Self-pay | Source: Home / Self Care | Attending: Gastroenterology

## 2022-10-09 ENCOUNTER — Ambulatory Visit (HOSPITAL_COMMUNITY): Payer: Medicare HMO | Admitting: Anesthesiology

## 2022-10-09 ENCOUNTER — Ambulatory Visit (HOSPITAL_COMMUNITY)
Admission: RE | Admit: 2022-10-09 | Discharge: 2022-10-09 | Disposition: A | Discharge status: Home / Self Care | Payer: Medicare HMO | Attending: Gastroenterology | Admitting: Gastroenterology

## 2022-10-09 ENCOUNTER — Other Ambulatory Visit: Payer: Self-pay

## 2022-10-09 ENCOUNTER — Ambulatory Visit (HOSPITAL_BASED_OUTPATIENT_CLINIC_OR_DEPARTMENT_OTHER): Payer: Medicare HMO | Admitting: Anesthesiology

## 2022-10-09 ENCOUNTER — Encounter (HOSPITAL_COMMUNITY): Payer: Self-pay | Admitting: Gastroenterology

## 2022-10-09 DIAGNOSIS — Z1211 Encounter for screening for malignant neoplasm of colon: Secondary | ICD-10-CM

## 2022-10-09 DIAGNOSIS — K648 Other hemorrhoids: Secondary | ICD-10-CM | POA: Diagnosis not present

## 2022-10-09 DIAGNOSIS — K649 Unspecified hemorrhoids: Secondary | ICD-10-CM | POA: Diagnosis not present

## 2022-10-09 HISTORY — PX: COLONOSCOPY WITH PROPOFOL: SHX5780

## 2022-10-09 HISTORY — DX: Personal history of urinary calculi: Z87.442

## 2022-10-09 LAB — HM COLONOSCOPY

## 2022-10-09 SURGERY — COLONOSCOPY WITH PROPOFOL
Anesthesia: General

## 2022-10-09 MED ORDER — LIDOCAINE HCL (CARDIAC) PF 100 MG/5ML IV SOSY
PREFILLED_SYRINGE | INTRAVENOUS | Status: DC | PRN
  Administered 2022-10-09: 80 mg via INTRAVENOUS

## 2022-10-09 MED ORDER — STERILE WATER FOR IRRIGATION IR SOLN
Status: DC | PRN
  Administered 2022-10-09: 50 mL

## 2022-10-09 MED ORDER — PROPOFOL 500 MG/50ML IV EMUL
INTRAVENOUS | Status: DC | PRN
  Administered 2022-10-09: 150 ug/kg/min via INTRAVENOUS

## 2022-10-09 MED ORDER — PHENYLEPHRINE 80 MCG/ML (10ML) SYRINGE FOR IV PUSH (FOR BLOOD PRESSURE SUPPORT)
PREFILLED_SYRINGE | INTRAVENOUS | Status: AC
  Filled 2022-10-09: qty 10

## 2022-10-09 MED ORDER — PHENYLEPHRINE 80 MCG/ML (10ML) SYRINGE FOR IV PUSH (FOR BLOOD PRESSURE SUPPORT)
PREFILLED_SYRINGE | INTRAVENOUS | Status: DC | PRN
  Administered 2022-10-09: 160 ug via INTRAVENOUS

## 2022-10-09 MED ORDER — LACTATED RINGERS IV SOLN: INTRAVENOUS | Status: DC | PRN

## 2022-10-09 MED ORDER — LIDOCAINE HCL (PF) 2 % IJ SOLN
INTRAMUSCULAR | Status: AC
  Filled 2022-10-09: qty 20

## 2022-10-09 MED ORDER — PROPOFOL 1000 MG/100ML IV EMUL
INTRAVENOUS | Status: AC
  Filled 2022-10-09: qty 100

## 2022-10-09 MED ORDER — PROPOFOL 10 MG/ML IV BOLUS
INTRAVENOUS | Status: DC | PRN
Start: 2022-10-09 — End: 2022-10-09
  Administered 2022-10-09: 100 mg via INTRAVENOUS

## 2022-10-09 NOTE — Discharge Instructions (Signed)
You are being discharged to home.  Resume your previous diet.  Your physician has recommended a repeat colonoscopy in 10 years for screening purposes.  

## 2022-10-09 NOTE — Transfer of Care (Addendum)
Immediate Anesthesia Transfer of Care Note  Patient: Roberta Barrett  Procedure(s) Performed: COLONOSCOPY WITH PROPOFOL  Patient Location: Short Stay  Anesthesia Type:General  Level of Consciousness: drowsy and patient cooperative  Airway & Oxygen Therapy: Patient Spontanous Breathing and Patient connected to nasal cannula oxygen  Post-op Assessment: Report given to RN and Post -op Vital signs reviewed and stable  Post vital signs: Reviewed and stable  Last Vitals:  Vitals Value Taken Time  BP 106/70 10/09/22   0755  Temp 36.6 10/09/22   0755  Pulse 65 10/09/22   0755  Resp 16 10/09/22   0755  SpO2 98% 10/09/22   0755    Last Pain:  Vitals:   10/09/22 0735  TempSrc:   PainSc: 0-No pain      Patients Stated Pain Goal: 7 (10/09/22 0707)  Complications: No notable events documented.

## 2022-10-09 NOTE — Anesthesia Postprocedure Evaluation (Signed)
Anesthesia Post Note  Patient: Pennie P Cohron  Procedure(s) Performed: COLONOSCOPY WITH PROPOFOL  Patient location during evaluation: Phase II Anesthesia Type: General Level of consciousness: awake Pain management: pain level controlled Vital Signs Assessment: post-procedure vital signs reviewed and stable Respiratory status: spontaneous breathing and respiratory function stable Cardiovascular status: blood pressure returned to baseline and stable Postop Assessment: no headache and no apparent nausea or vomiting Anesthetic complications: no Comments: Late entry   No notable events documented.   Last Vitals:  Vitals:   10/09/22 0707 10/09/22 0755  BP: (!) 149/76 106/70  Pulse: 79 65  Resp: 13 16  Temp: 36.5 C 36.6 C  SpO2: 100% 98%    Last Pain:  Vitals:   10/09/22 0755  TempSrc: Axillary  PainSc: 0-No pain                 Windell Norfolk

## 2022-10-09 NOTE — Op Note (Signed)
[facility] Patient Name: Roberta Barrett Procedure Date: 10/09/2022 7:13 AM MRN: [id number] Date of Birth: 1955-05-10 Attending MD: Katrinka Blazing , , [id number] CSN: [id number] Age: 67 Admit Type: Outpatient Procedure:                Colonoscopy Indications:              Screening for colorectal malignant neoplasm Providers:                Katrinka Blazing, Buel Ream. Thomasena Edis RN, RN, Lennice Sites Technician, Pensions consultant Referring MD:              Medicines:                Monitored Anesthesia Care Complications:            No immediate complications. Estimated Blood Loss:     Estimated blood loss: none. Procedure:                Pre-Anesthesia Assessment:                           - Prior to the procedure, a History and Physical                            was performed, and patient medications, allergies                            and sensitivities were reviewed. The patient's                            tolerance of previous anesthesia was reviewed.                           - The risks and benefits of the procedure and the                            sedation options and risks were discussed with the                            patient. All questions were answered and informed                            consent was obtained.                           - ASA Grade Assessment: I - A normal, healthy                            patient.                           After obtaining informed consent, the colonoscope                            was passed under direct vision. Throughout the  procedure, the patient's blood pressure, pulse, and                            oxygen saturations were monitored continuously. The                            [id number]-HQ190L ([id number]) scope was introduced through                            the anus and advanced to the the terminal ileum.                            The colonoscopy was performed without  difficulty.                            The patient tolerated the procedure well. The                            quality of the bowel preparation was excellent. Scope In: 7:40:40 AM Scope Out: 7:50:25 AM Scope Withdrawal Time: 0 hours 6 minutes 7 seconds  Total Procedure Duration: 0 hours 9 minutes 45 seconds  Findings:      The perianal and digital rectal examinations were normal.      The terminal ileum appeared normal.      The entire examined colon appeared normal.      Non-bleeding internal hemorrhoids were found during retroflexion. The       hemorrhoids were small. Impression:               - The examined portion of the ileum was normal.                           - The entire examined colon is normal.                           - Non-bleeding internal hemorrhoids.                           - No specimens collected. Moderate Sedation:      Per Anesthesia Care Recommendation:           - Discharge patient to home (ambulatory).                           - Resume previous diet.                           - Repeat colonoscopy in 10 years for screening                            purposes. Procedure Code(s):        --- Professional ---                           Q4696, Colorectal cancer screening; colonoscopy on  individual not meeting criteria for high risk Diagnosis Code(s):        --- Professional ---                           Z12.11, Encounter for screening for malignant                            neoplasm of colon                           K64.8, Other hemorrhoids CPT copyright 2022 American Medical Association. All rights reserved. The codes documented in this report are preliminary and upon coder review may  be revised to meet current compliance requirements. Katrinka Blazing, MD Katrinka Blazing,  10/09/2022 7:53:49 AM This report has been signed electronically. Number of Addenda: 0

## 2022-10-09 NOTE — Anesthesia Preprocedure Evaluation (Signed)
Anesthesia Evaluation  Patient identified by MRN, date of birth, ID band Patient awake    Reviewed: Allergy & Precautions, H&P , NPO status , Patient's Chart, lab work & pertinent test results, reviewed documented beta blocker date and time   Airway Mallampati: II  TM Distance: >3 FB Neck ROM: full    Dental no notable dental hx.    Pulmonary neg pulmonary ROS   Pulmonary exam normal breath sounds clear to auscultation       Cardiovascular Exercise Tolerance: Good negative cardio ROS  Rhythm:regular Rate:Normal     Neuro/Psych negative neurological ROS  negative psych ROS   GI/Hepatic negative GI ROS, Neg liver ROS,,,  Endo/Other  negative endocrine ROS    Renal/GU negative Renal ROS  negative genitourinary   Musculoskeletal   Abdominal   Peds  Hematology negative hematology ROS (+)   Anesthesia Other Findings   Reproductive/Obstetrics negative OB ROS                             Anesthesia Physical Anesthesia Plan  ASA: 2  Anesthesia Plan: General   Post-op Pain Management:    Induction:   PONV Risk Score and Plan: Propofol infusion  Airway Management Planned:   Additional Equipment:   Intra-op Plan:   Post-operative Plan:   Informed Consent: I have reviewed the patients History and Physical, chart, labs and discussed the procedure including the risks, benefits and alternatives for the proposed anesthesia with the patient or authorized representative who has indicated his/her understanding and acceptance.     Dental Advisory Given  Plan Discussed with: CRNA  Anesthesia Plan Comments:        Anesthesia Quick Evaluation  

## 2022-10-09 NOTE — H&P (Signed)
Roberta Barrett is an 66 y.o. female.   Chief Complaint: screening colonoscopy HPI: 67 y/o F with PMH osteoporosis, coming for screening colonoscopy. Last colonoscopy ion 2014 was WNL. The patient denies having any complaints such as melena, hematochezia, abdominal pain or distention, change in her bowel movement consistency or frequency, no changes in weight recently.  No family history of colorectal cancer.   Past Medical History:  Diagnosis Date   Dysplasia of cervix, unspecified 80's   History of kidney stones    Osteoporosis    Shingles 12/08/2014   Vaginal Pap smear, abnormal     Past Surgical History:  Procedure Laterality Date   BREAST BIOPSY Left    benign   CATARACT EXTRACTION     COLONOSCOPY N/A 09/15/2012   Procedure: COLONOSCOPY;  Surgeon: Malissa Hippo, MD;  [facility];  Service: Endoscopy;  Laterality: N/A;  915   EYE SURGERY     HAMMER TOE SURGERY     LASER ABLATION OF THE CERVIX  1985   laser conization     WRIST SURGERY      Family History  Problem Relation Age of Onset   Hypertension Mother    COPD Father    Cancer Sister 35       cervical cancer   Cancer Maternal Uncle    Cancer Paternal Aunt    COPD Paternal Aunt    Cancer Paternal Uncle    COPD Paternal Uncle    Social History:  reports that she has never smoked. She has never used smokeless tobacco. She reports that she does not drink alcohol and does not use drugs.  Allergies:  Allergies  Allergen Reactions   Bee Venom Hives, Shortness Of Breath and Swelling   Elemental Sulfur Rash   Penicillins Rash    Medications Prior to Admission  Medication Sig Dispense Refill   CALCIUM PO Take by mouth.     Cholecalciferol (VITAMIN D-3 PO) Take by mouth.     EPINEPHrine 0.3 mg/0.3 mL IJ SOAJ injection Inject 0.3 mg into the muscle as needed for anaphylaxis. 1 each 1   MAGNESIUM PO Take by mouth.     Menaquinone-7 (VITAMIN K2 PO) Take by mouth.     Multiple Vitamins-Minerals (HAIR  SKIN & NAILS PO) Take by mouth.     Omega-3 Fatty Acids (FISH OIL PO) Take by mouth.     risedronate (ACTONEL) 150 MG tablet Take 1 tablet (150 mg total) by mouth every 30 (thirty) days. with water on empty stomach, nothing by mouth or lie down for next 30 minutes. 1 tablet 12    No results found for this or any previous visit (from the past 48 hour(s)). No results found.  Review of Systems  All other systems reviewed and are negative.   Blood pressure (!) 149/76, pulse 79, temperature 97.7 F (36.5 C), temperature source Oral, resp. rate 13, height 5' (1.524 m), weight 69.4 kg, SpO2 100%. Physical Exam  GENERAL: The patient is AO x3, in no acute distress. HEENT: Head is normocephalic and atraumatic. EOMI are intact. Mouth is well hydrated and without lesions. NECK: Supple. No masses LUNGS: Clear to auscultation. No presence of rhonchi/wheezing/rales. Adequate chest expansion HEART: RRR, normal s1 and s2. ABDOMEN: Soft, nontender, no guarding, no peritoneal signs, and nondistended. BS +. No masses. EXTREMITIES: Without any cyanosis, clubbing, rash, lesions or edema. NEUROLOGIC: AOx3, no focal motor deficit. SKIN: no jaundice, no rashes  Assessment/Plan 67 y/o F with PMH osteoporosis,  coming for screening colonoscopy. The patient is at average risk for colorectal cancer.  We will proceed with colonoscopy today.   Dolores Frame, MD 10/09/2022, 7:28 AM

## 2022-10-14 ENCOUNTER — Encounter (INDEPENDENT_AMBULATORY_CARE_PROVIDER_SITE_OTHER): Payer: Self-pay | Admitting: *Deleted

## 2022-10-15 ENCOUNTER — Encounter (HOSPITAL_COMMUNITY): Payer: Self-pay | Admitting: Gastroenterology

## 2022-10-27 ENCOUNTER — Ambulatory Visit: Payer: Medicare HMO | Admitting: Family Medicine

## 2022-11-06 ENCOUNTER — Ambulatory Visit: Payer: Medicare HMO | Admitting: Family Medicine

## 2022-11-13 ENCOUNTER — Ambulatory Visit (INDEPENDENT_AMBULATORY_CARE_PROVIDER_SITE_OTHER): Payer: Medicare HMO

## 2022-11-13 ENCOUNTER — Ambulatory Visit: Payer: Medicare HMO | Admitting: Family Medicine

## 2022-11-13 DIAGNOSIS — M25511 Pain in right shoulder: Secondary | ICD-10-CM

## 2022-11-13 DIAGNOSIS — M545 Low back pain, unspecified: Secondary | ICD-10-CM
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
## 2022-11-18 ENCOUNTER — Ambulatory Visit: Payer: Medicare HMO | Admitting: Family Medicine

## 2022-12-04 ENCOUNTER — Telehealth: Payer: Self-pay | Admitting: Family Medicine

## 2022-12-04 NOTE — Telephone Encounter (Signed)
Report not available yet. Can we check with radiology?

## 2022-12-07 NOTE — Telephone Encounter (Signed)
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

## 2022-12-08 NOTE — Telephone Encounter (Signed)
Patient aware.

## 2022-12-26 ENCOUNTER — Other Ambulatory Visit (HOSPITAL_COMMUNITY)
Admission: RE | Admit: 2022-12-26 | Discharge: 2022-12-26 | Disposition: A | Discharge status: Home / Self Care | Payer: Medicare HMO | Source: Ambulatory Visit | Attending: Adult Health | Admitting: Adult Health

## 2022-12-26 ENCOUNTER — Encounter: Payer: Self-pay | Admitting: Adult Health

## 2022-12-26 ENCOUNTER — Ambulatory Visit: Payer: Medicare HMO | Admitting: Adult Health

## 2022-12-26 VITALS — BP 139/86 | HR 84 | Ht 60.0 in | Wt 155.0 lb

## 2022-12-26 DIAGNOSIS — N888 Other specified noninflammatory disorders of cervix uteri: Secondary | ICD-10-CM | POA: Insufficient documentation

## 2022-12-26 DIAGNOSIS — Z1211 Encounter for screening for malignant neoplasm of colon: Secondary | ICD-10-CM

## 2022-12-26 DIAGNOSIS — Z1151 Encounter for screening for human papillomavirus (HPV): Secondary | ICD-10-CM | POA: Diagnosis not present

## 2022-12-26 DIAGNOSIS — Z01419 Encounter for gynecological examination (general) (routine) without abnormal findings: Secondary | ICD-10-CM | POA: Diagnosis not present

## 2022-12-26 DIAGNOSIS — Z780 Asymptomatic menopausal state: Secondary | ICD-10-CM

## 2022-12-26 DIAGNOSIS — Z1331 Encounter for screening for depression: Secondary | ICD-10-CM | POA: Diagnosis not present

## 2022-12-26 DIAGNOSIS — N952 Postmenopausal atrophic vaginitis: Secondary | ICD-10-CM

## 2022-12-26 DIAGNOSIS — M810 Age-related osteoporosis without current pathological fracture: Secondary | ICD-10-CM

## 2022-12-26 LAB — HEMOCCULT GUIAC POC 1CARD (OFFICE): Fecal Occult Blood, POC: NEGATIVE

## 2022-12-26 MED ORDER — RISEDRONATE SODIUM 150 MG PO TABS: 150.0000 mg | ORAL_TABLET | ORAL | 12 refills | Status: DC

## 2022-12-26 NOTE — Progress Notes (Signed)
Patient ID: Roberta Barrett, female   DOB: September 05, 1955, 67 y.o.   MRN: [id number] History of Present Illness: Roberta Barrett is a 67 year old white female,married, PM in for a well woman gyn exam and pap.     Component Value Date/Time   DIAGPAP  12/23/2021 1454    - Negative for intraepithelial lesion or malignancy (NILM)   DIAGPAP - Non-diagnostic (A) 12/05/2020 1048   DIAGPAP  12/05/2019 1057    - Negative for intraepithelial lesion or malignancy (NILM)   DIAGPAP - Atrophic vaginitis 12/05/2019 1057   HPVHIGH Positive (A) 12/23/2021 1454   HPVHIGH Other 12/05/2020 1048   HPVHIGH Negative 12/05/2019 1057   ADEQPAP  12/23/2021 1454    Satisfactory for evaluation; transformation zone component PRESENT.   ADEQPAP  12/05/2020 1048    UNSATISFACTORY for evaluation due to obscuring inflammation. The   [facility]  12/05/2020 1048    specimen is processed and examined microscopically, but is found to be   ADEQPAP  12/05/2020 1048    unsatisfactory for evaluation of an epithelial abnormality. Repeat study   ADEQPAP recommended. 12/05/2020 1048   PCP is Dr Darlyn Read   Current Medications, Allergies, Past Medical History, Past Surgical History, Family History and Social History were reviewed in [employer] electronic medical record.     Review of Systems: Patient denies any headaches, hearing loss, fatigue, blurred vision, shortness of breath, chest pain, abdominal pain, problems with bowel movements, urination, or intercourse(not active). No joint pain or mood swings.  Denies any vaginal bleeding    Physical Exam:BP 139/86 (BP Location: Right Arm, Patient Position: Sitting, Cuff Size: Normal)   Pulse 84   Ht 5' (1.524 m)   Wt 155 lb (70.3 kg)   BMI 30.27 kg/m   General:  Well developed, well nourished, no acute distress Skin:  Warm and dry Neck:  Midline trachea, normal thyroid, good ROM, no lymphadenopathy,no carotid bruits heard  Lungs; Clear to auscultation bilaterally Breast:  No dominant  palpable mass, retraction, or nipple discharge Cardiovascular: Regular rate and rhythm Abdomen:  Soft, non tender, no hepatosplenomegaly Pelvic:  External genitalia is normal in appearance, no lesions.  The vagina is pale and attrophic. Urethra has no lesions or masses. The cervix is smooth, pap with HR HPV genotyping performed.  Uterus is felt to be normal size, shape, and contour.  No adnexal masses or tenderness noted.Bladder is non tender, no masses felt. Rectal: Good sphincter tone, no polyps, or hemorrhoids felt.  Hemoccult negative. Extremities/musculoskeletal:  No swelling or varicosities noted, no clubbing or cyanosis Psych:  No mood changes, alert and cooperative,seems happy AA is 0 Fall risk is low    12/26/2022    9:44 AM 06/26/2022    9:20 AM 04/24/2022    9:12 AM  Depression screen PHQ 2/9  Decreased Interest 0 0 0  Down, Depressed, Hopeless 0 0 0  PHQ - 2 Score 0 0 0  Altered sleeping 0    Tired, decreased energy 0    Change in appetite 0    Feeling bad or failure about yourself  0    Trouble concentrating 0    Moving slowly or fidgety/restless 0    Suicidal thoughts 0    PHQ-9 Score 0         12/26/2022    9:46 AM 12/26/2022    9:45 AM 12/23/2021    2:13 PM 12/05/2020   10:50 AM  GAD 7 : Generalized Anxiety Score  Nervous, Anxious, on Edge  0 0 0 0  Control/stop worrying 0 0 0 0  Worry too much - different things 0 0 0 0  Trouble relaxing 0 0 0 0  Restless 0 0 0 0  Easily annoyed or irritable 0 0 0 0  Afraid - awful might happen 0 0 0 0  Total GAD 7 Score 0 0 0 0    Upstream - 12/26/22 0945       Pregnancy Intention Screening   Does the patient want to become pregnant in the next year? N/A    Does the patient's partner want to become pregnant in the next year? N/A    Would the patient like to discuss contraceptive options today? N/A      Contraception Wrap Up   Current Method No Contraceptive Precautions   post menopause   End Method No Contraception  Precautions   post menopause   Contraception Counseling Provided No              Examination chaperoned by Faith Rogue LPN  Impression and Plan: 1. Encounter for gynecological examination with Papanicolaou smear of cervix Pap sent Physical in 1 year Mammogram was 05/05/22 with follow up 05/13/22, normal Colonoscopy per GI Labs with PCP - Cytology - PAP( [place])  2. Vaginal atrophy  3. Encounter for screening fecal occult blood testing Hemoccult was negative  - POCT occult blood stool  4. Postmenopause Denies any vaginal bleeding   5. Age-related osteoporosis without current pathological fracture Staying active, walking Take calcium and vitamin D and Actonel Meds ordered this encounter  Medications   risedronate (ACTONEL) 150 MG tablet    Sig: Take 1 tablet (150 mg total) by mouth every 30 (thirty) days. with water on empty stomach, nothing by mouth or lie down for next 30 minutes.    Dispense:  1 tablet    Refill:  12    Order Specific Question:   Supervising Provider    Answer:   Lazaro Arms [[id number]]    Will repeat DEXA next year

## 2022-12-30 LAB — CYTOLOGY - PAP
Comment: NEGATIVE
High risk HPV: NEGATIVE

## 2023-01-01 ENCOUNTER — Encounter: Payer: Self-pay | Admitting: Family Medicine

## 2023-01-01 ENCOUNTER — Ambulatory Visit (INDEPENDENT_AMBULATORY_CARE_PROVIDER_SITE_OTHER): Payer: Medicare HMO | Admitting: Family Medicine

## 2023-01-01 VITALS — BP 146/77 | HR 71 | Temp 97.5°F | Ht 60.0 in | Wt 154.4 lb

## 2023-01-01 DIAGNOSIS — R030 Elevated blood-pressure reading, without diagnosis of hypertension: Secondary | ICD-10-CM

## 2023-01-01 DIAGNOSIS — E7800 Pure hypercholesterolemia, unspecified: Secondary | ICD-10-CM

## 2023-01-01 NOTE — Progress Notes (Signed)
Subjective:  Patient ID: Roberta Barrett, female    DOB: 1955-12-24  Age: 67 y.o. MRN: [id number]  CC: Medical Management of Chronic Issues   HPI Roberta Barrett presents for  follow-up of hypertension. Patient has no history of headache chest pain or shortness of breath or recent cough. Patient also denies symptoms of TIA such as focal numbness or weakness. Patient denies side effects from medication. States taking it regularly. Home Readings 120s/70s &80s   in for follow-up of elevated cholesterol. Doing well without complaints on current medication. Denies side effects of statin including myalgia and arthralgia and nausea. Currently no chest pain, shortness of breath or other cardiovascular related symptoms noted.    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   She has a past surgical history that includes laser conization; Laser ablation of the cervix (1985); Colonoscopy (N/A, 09/15/2012); Hammer toe surgery; Breast biopsy (Left); Wrist surgery; Eye surgery; Cataract extraction; and Colonoscopy with propofol (N/A, 10/09/2022).   Her family history includes COPD in her father, paternal aunt, and paternal uncle; Cancer in her maternal uncle, paternal aunt, and paternal uncle; Cancer (age of onset: 58) in her sister; Hypertension in her mother.She reports that she has never smoked. She has never used smokeless tobacco. She reports that she does not drink alcohol and does not use drugs.  Current Outpatient Medications on File Prior to Visit  Medication Sig Dispense Refill   CALCIUM PO Take by mouth.     Cholecalciferol (VITAMIN D-3 PO) Take by mouth.     EPINEPHrine 0.3 mg/0.3 mL IJ SOAJ injection Inject 0.3 mg into the muscle as needed for anaphylaxis. 1 each 1   MAGNESIUM PO Take by mouth.     Menaquinone-7 (VITAMIN K2 PO) Take by mouth.      Multiple Vitamins-Minerals (HAIR SKIN & NAILS PO) Take by mouth.     Omega-3 Fatty Acids (FISH OIL PO) Take by mouth.     risedronate (ACTONEL) 150 MG tablet Take 1 tablet (150 mg total) by mouth every 30 (thirty) days. with water on empty stomach, nothing by mouth or lie down for next 30 minutes. 1 tablet 12   No current facility-administered medications on file prior to visit.    ROS Review of Systems  Constitutional: Negative.   HENT: Negative.    Eyes:  Negative for visual disturbance.  Respiratory:  Negative for shortness of breath.   Cardiovascular:  Negative for chest pain.  Gastrointestinal:  Negative for abdominal pain.  Musculoskeletal:  Negative for arthralgias.    Objective:  BP (!) 146/77   Pulse 71   Temp (!) 97.5 F (36.4 C)   Ht 5' (1.524 m)   Wt 154 lb 6.4 oz (70 kg)   SpO2 100%   BMI 30.15 kg/m   BP Readings from Last 3 Encounters:  01/01/23 (!) 146/77  12/26/22 139/86  11/13/22 135/79    Wt Readings from Last 3 Encounters:  01/01/23 154 lb 6.4 oz (70 kg)  12/26/22 155 lb (70.3 kg)  11/13/22 155 lb 12.8 oz (70.7 kg)     Physical Exam Constitutional:      General: She is not in acute distress.    Appearance: She is well-developed.  Cardiovascular:     Rate and Rhythm: Normal rate and regular rhythm.  Pulmonary:     Breath sounds: Normal breath sounds.  Musculoskeletal:  General: Normal range of motion.  Skin:    General: Skin is warm and dry.  Neurological:     Mental Status: She is alert and oriented to person, place, and time.       Assessment & Plan:   Roberta Barrett was seen today for medical management of chronic issues.  Diagnoses and all orders for this visit:  Elevated cholesterol -     Lipid panel  Elevated BP without diagnosis of hypertension -     CBC with Differential/Platelet -     CMP14+EGFR   Allergies as of 01/01/2023       Reactions   Bee Venom Hives, Shortness Of Breath, Swelling   Elemental Sulfur Rash    Penicillins Rash        Medication List        Accurate as of January 01, 2023  9:25 AM. If you have any questions, ask your nurse or doctor.          CALCIUM PO Take by mouth.   EPINEPHrine 0.3 mg/0.3 mL Soaj injection Commonly known as: EPI-PEN Inject 0.3 mg into the muscle as needed for anaphylaxis.   FISH OIL PO Take by mouth.   HAIR SKIN & NAILS PO Take by mouth.   MAGNESIUM PO Take by mouth.   risedronate 150 MG tablet Commonly known as: Actonel Take 1 tablet (150 mg total) by mouth every 30 (thirty) days. with water on empty stomach, nothing by mouth or lie down for next 30 minutes.   VITAMIN D-3 PO Take by mouth.   VITAMIN K2 PO Take by mouth.        No orders of the defined types were placed in this encounter.   Pt. To collect BP data at home for a weekand drop it off to make sure that readings are staying at goal.  Follow-up: Return in about 6 months (around 07/01/2023) for [facility] physical.  Mechele Claude, M.D.

## 2023-01-02 ENCOUNTER — Other Ambulatory Visit: Payer: Self-pay

## 2023-01-02 DIAGNOSIS — E7800 Pure hypercholesterolemia, unspecified: Secondary | ICD-10-CM

## 2023-01-02 LAB — CMP14+EGFR
ALT: 19 [IU]/L (ref 0–32)
AST: 21 [IU]/L (ref 0–40)
Albumin: 4.4 g/dL (ref 3.9–4.9)
Alkaline Phosphatase: 56 [IU]/L (ref 44–121)
BUN/Creatinine Ratio: 15 (ref 12–28)
BUN: 15 mg/dL (ref 8–27)
Bilirubin Total: 0.6 mg/dL (ref 0.0–1.2)
CO2: 23 mmol/L (ref 20–29)
Calcium: 9.2 mg/dL (ref 8.7–10.3)
Chloride: 105 mmol/L (ref 96–106)
Creatinine, Ser: 1 mg/dL (ref 0.57–1.00)
Globulin, Total: 3 g/dL (ref 1.5–4.5)
Glucose: 93 mg/dL (ref 70–99)
Potassium: 4.5 mmol/L (ref 3.5–5.2)
Sodium: 142 mmol/L (ref 134–144)
Total Protein: 7.4 g/dL (ref 6.0–8.5)
eGFR: 62 mL/min/{1.73_m2} (ref >59)

## 2023-01-02 LAB — CBC WITH DIFFERENTIAL/PLATELET
Basophils Absolute: 0.1 10*3/uL (ref 0.0–0.2)
Basos: 1 %
EOS (ABSOLUTE): 0.1 10*3/uL (ref 0.0–0.4)
Eos: 3 %
Hematocrit: 40.8 % (ref 34.0–46.6)
Hemoglobin: 13.2 g/dL (ref 11.1–15.9)
Immature Grans (Abs): 0 10*3/uL (ref 0.0–0.1)
Immature Granulocytes: 0 %
Lymphocytes Absolute: 1.8 10*3/uL (ref 0.7–3.1)
Lymphs: 41 %
MCH: 27.8 pg (ref 26.6–33.0)
MCHC: 32.4 g/dL (ref 31.5–35.7)
MCV: 86 fL (ref 79–97)
Monocytes Absolute: 0.4 10*3/uL (ref 0.1–0.9)
Monocytes: 9 %
Neutrophils Absolute: 2 10*3/uL (ref 1.4–7.0)
Neutrophils: 46 %
Platelets: 157 10*3/uL (ref 150–450)
RBC: 4.75 x10E6/uL (ref 3.77–5.28)
RDW: 13.3 % (ref 11.7–15.4)
WBC: 4.3 10*3/uL (ref 3.4–10.8)

## 2023-01-02 LAB — LIPID PANEL
Chol/HDL Ratio: 3.7 ratio (ref 0.0–4.4)
Cholesterol, Total: 199 mg/dL (ref 100–199)
HDL: 54 mg/dL (ref >39)
LDL Chol Calc (NIH): 124 mg/dL — ABNORMAL HIGH (ref 0–99)
Triglycerides: 118 mg/dL (ref 0–149)
VLDL Cholesterol Cal: 21 mg/dL (ref 5–40)

## 2023-03-18 ENCOUNTER — Other Ambulatory Visit (HOSPITAL_COMMUNITY): Payer: Self-pay | Admitting: Adult Health

## 2023-03-18 DIAGNOSIS — Z1231 Encounter for screening mammogram for malignant neoplasm of breast: Secondary | ICD-10-CM

## 2023-04-16 ENCOUNTER — Ambulatory Visit: Payer: Medicare HMO | Admitting: Urology

## 2023-04-16 VITALS — BP 169/73 | HR 81

## 2023-04-16 DIAGNOSIS — R3129 Other microscopic hematuria: Secondary | ICD-10-CM

## 2023-04-16 DIAGNOSIS — Z87440 Personal history of urinary (tract) infections: Secondary | ICD-10-CM | POA: Diagnosis not present

## 2023-04-16 DIAGNOSIS — N3941 Urge incontinence: Secondary | ICD-10-CM | POA: Diagnosis not present

## 2023-04-16 NOTE — Progress Notes (Signed)
 Subjective:  1. Microscopic hematuria   2. Personal history of urinary infection   3. Urge incontinence      CC: Frequency and urgency.   Roberta Barrett returns today in f/u. She has a history of recurrent UTI's and keeps macrodantin on hand for breakthroughs but hasn't used it in the last year.  She continues to have urgency on occasional with rare UUI but has no SUI and doesn't require pad.   She has some frequency and isn't sure if it is related to the calcium she is taking for her recent diagnosis of osteoporosis.  She is on actonel as well.  She is also on magnesium.   She has had no hematuria or dysuria. She had a stone in 1989 but none since and no worrisome symptoms.   UA has trace blood as usual Her Cr was 1.0 in 11/24.       ROS:  ROS:  A complete review of systems was performed.  All systems are negative except for pertinent findings as noted.   Review of Systems  Genitourinary:  Positive for frequency and urgency.    Allergies  Allergen Reactions   Bee Venom Hives, Shortness Of Breath and Swelling   Elemental Sulfur Rash   Penicillins Rash    Outpatient Encounter Medications as of 04/16/2023  Medication Sig   CALCIUM PO Take by mouth.   Cholecalciferol (VITAMIN D-3 PO) Take by mouth.   EPINEPHrine 0.3 mg/0.3 mL IJ SOAJ injection Inject 0.3 mg into the muscle as needed for anaphylaxis.   MAGNESIUM PO Take by mouth.   Menaquinone-7 (VITAMIN K2 PO) Take by mouth.   Multiple Vitamins-Minerals (HAIR SKIN & NAILS PO) Take by mouth.   Omega-3 Fatty Acids (FISH OIL PO) Take by mouth.   risedronate (ACTONEL) 150 MG tablet Take 1 tablet (150 mg total) by mouth every 30 (thirty) days. with water on empty stomach, nothing by mouth or lie down for next 30 minutes.   No facility-administered encounter medications on file as of 04/16/2023.    Past Medical History:  Diagnosis Date   Dysplasia of cervix, unspecified 80's   History of kidney stones    Osteoporosis    Shingles  12/08/2014   Vaginal Pap smear, abnormal     Past Surgical History:  Procedure Laterality Date   BREAST BIOPSY Left    benign   CATARACT EXTRACTION     COLONOSCOPY N/A 09/15/2012   Procedure: COLONOSCOPY;  Surgeon: Malissa Hippo, MD;  [facility]: [facility];  Service: Endoscopy;  Laterality: N/A;  915   COLONOSCOPY WITH PROPOFOL N/A 10/09/2022   Procedure: COLONOSCOPY WITH PROPOFOL;  Surgeon: Dolores Frame, MD;  Location: [facility];  Service: Gastroenterology;  Laterality: N/A;  730am, asa 2   EYE SURGERY     HAMMER TOE SURGERY     LASER ABLATION OF THE CERVIX  1985   laser conization     WRIST SURGERY      Social History   Socioeconomic History   Marital status: Married    Spouse name: Not on file   Number of children: 0   Years of education: Not on file   Highest education level: Some college, no degree  Occupational History   Not on file  Tobacco Use   Smoking status: Never   Smokeless tobacco: Never  Vaping Use   Vaping status: Never Used  Substance and Sexual Activity   Alcohol use: No   Drug use: No   Sexual  activity: Not Currently    Birth control/protection: Post-menopausal  Other Topics Concern   Not on file  Social History Narrative   Not on file   Social Drivers of Health   Financial Resource Strain: Low Risk  (12/28/2022)   Overall Financial Resource Strain (CARDIA)    Difficulty of Paying Living Expenses: Not hard at all  Food Insecurity: No Food Insecurity (12/28/2022)   Hunger Vital Sign    Worried About Running Out of Food in the Last Year: Never true    Ran Out of Food in the Last Year: Never true  Transportation Needs: No Transportation Needs (12/28/2022)   PRAPARE - Administrator, Civil Service (Medical): No    Lack of Transportation (Non-Medical): No  Physical Activity: Sufficiently Active (12/28/2022)   Exercise Vital Sign    Days of Exercise per Week: 6 days    Minutes of Exercise per Session: 30 min   Stress: No Stress Concern Present (12/28/2022)   [employer] of Occupational Health - Occupational Stress Questionnaire    Feeling of Stress : Not at all  Social Connections: Socially Integrated (12/28/2022)   Social Connection and Isolation Panel [NHANES]    Frequency of Communication with Friends and Family: Three times a week    Frequency of Social Gatherings with Friends and Family: Once a week    Attends Religious Services: More than 4 times per year    Active Member of Clubs or Organizations: Yes    Attends Banker Meetings: More than 4 times per year    Marital Status: Married  Catering manager Violence: Not At Risk (12/26/2022)   Humiliation, Afraid, Rape, and Kick questionnaire    Fear of Current or Ex-Partner: No    Emotionally Abused: No    Physically Abused: No    Sexually Abused: No    Family History  Problem Relation Age of Onset   Hypertension Mother    COPD Father    Cancer Sister 109       cervical cancer   Cancer Maternal Uncle    Cancer Paternal Aunt    COPD Paternal Aunt    Cancer Paternal Uncle    COPD Paternal Uncle        Objective: Vitals:   04/16/23 1043 04/16/23 1056  BP: (!) 170/93 (!) 169/73  Pulse: 81    BP was 122/77 at home yesterday.    Physical Exam  Lab Results:  Results for orders placed or performed in visit on 04/16/23 (from the past 24 hours)  Urinalysis, Routine w reflex microscopic     Status: Abnormal   Collection Time: 04/16/23 11:00 AM  Result Value Ref Range   Specific Gravity, UA <1.005 (L) 1.005 - 1.030   pH, UA 6.5 5.0 - 7.5   Color, UA Yellow Yellow   Appearance Ur Clear Clear   Leukocytes,UA Negative Negative   Protein,UA Negative Negative/Trace   Glucose, UA Negative Negative   Ketones, UA Negative Negative   RBC, UA Trace (A) Negative   Bilirubin, UA Negative Negative   Urobilinogen, Ur 0.2 0.2 - 1.0 mg/dL   Nitrite, UA Negative Negative   Microscopic Examination See below:     Narrative   Performed at:  [address] - [facility] [address], [place], [place]  [id number] Lab Director: Chinita Pester MT, Phone:  [phone]  Microscopic Examination     Status: None   Collection Time: 04/16/23 11:00 AM   Urine  Result Value Ref Range  WBC, UA 0-5 0 - 5 /hpf   RBC, Urine 0-2 0 - 2 /hpf   Epithelial Cells (non renal) 0-10 0 - 10 /hpf   Bacteria, UA None seen None seen/Few   Narrative   Performed at:  [address] - [facility] [address] [place], [place]  [id number] Lab Director: Chinita Pester MT, Phone:  [phone]    UA has tr blood.   EPIC notes and labs reviewed.  Cr noted above.      Assessment & Plan: Recurrent UTI's.  She has done well without symptomatic episode over the past year.   She will call if she has a need for the macrobid.   Microhematuria.  She has only trace blood today.  UUI.   She has minimal UUI that doesn't require treatment.    No orders of the defined types were placed in this encounter.    Orders Placed This Encounter  Procedures   Microscopic Examination   Urinalysis, Routine w reflex microscopic      Return if symptoms worsen or fail to improve.   CC: Mechele Claude, MD      Bjorn Pippin 04/17/2023

## 2023-04-17 LAB — URINALYSIS, ROUTINE W REFLEX MICROSCOPIC
Bilirubin, UA: NEGATIVE
Glucose, UA: NEGATIVE
Ketones, UA: NEGATIVE
Leukocytes,UA: NEGATIVE
Nitrite, UA: NEGATIVE
Protein,UA: NEGATIVE
Specific Gravity, UA: <1.005 — ABNORMAL LOW (ref 1.005–1.030)
Urobilinogen, Ur: 0.2 mg/dL (ref 0.2–1.0)
pH, UA: 6.5 (ref 5.0–7.5)

## 2023-04-17 LAB — MICROSCOPIC EXAMINATION: Bacteria, UA: NONE SEEN

## 2023-04-27 ENCOUNTER — Encounter: Payer: Medicare HMO | Admitting: Family Medicine

## 2023-05-07 ENCOUNTER — Ambulatory Visit (HOSPITAL_COMMUNITY): Payer: Medicare HMO

## 2023-05-08 ENCOUNTER — Ambulatory Visit (HOSPITAL_COMMUNITY)
Admission: RE | Admit: 2023-05-08 | Discharge: 2023-05-08 | Disposition: A | Discharge status: Home / Self Care | Source: Ambulatory Visit | Attending: Adult Health | Admitting: Adult Health

## 2023-05-08 DIAGNOSIS — Z1231 Encounter for screening mammogram for malignant neoplasm of breast: Secondary | ICD-10-CM | POA: Insufficient documentation

## 2023-06-29 ENCOUNTER — Ambulatory Visit: Payer: Medicare HMO

## 2023-06-29 VITALS — BP 169/73 | HR 81 | Ht 61.0 in | Wt 154.0 lb

## 2023-06-29 DIAGNOSIS — Z0000 Encounter for general adult medical examination without abnormal findings: Secondary | ICD-10-CM

## 2023-06-29 NOTE — Progress Notes (Signed)
 Subjective:   Roberta Barrett is a 68 y.o. who presents for a Medicare Wellness preventive visit.  As a reminder, Annual Wellness Visits don't include a physical exam, and some assessments may be limited, especially if this visit is performed virtually. We may recommend an in-person follow-up visit with your provider if needed.  Visit Complete: Virtual I connected with  Roberta Barrett on 06/29/23 by a audio enabled telemedicine application and verified that I am speaking with the correct person using two identifiers.  Patient Location: Home  Provider Location: Home Office  I discussed the limitations of evaluation and management by telemedicine. The patient expressed understanding and agreed to proceed.  Vital Signs: Because this visit was a virtual/telehealth visit, some criteria may be missing or patient reported. Any vitals not documented were not able to be obtained and vitals that have been documented are patient reported.  VideoDeclined- This patient declined Librarian, academic. Therefore the visit was completed with audio only.  Persons Participating in Visit: Patient.  AWV Questionnaire: Yes: Patient Medicare AWV questionnaire was completed by the patient on 06/26/23; I have confirmed that all information answered by patient is correct and no changes since this date.  Cardiac Risk Factors include: advanced age (>19men, >70 women)     Objective:     Today's Vitals   06/29/23 1001  BP: (!) 169/73  Pulse: 81  Weight: 154 lb (69.9 kg)  Height: 5\' 1"  (1.549 m)   Body mass index is 29.1 kg/m.     06/29/2023    9:55 AM 10/09/2022    6:55 AM 06/26/2022    9:21 AM 09/15/2012    8:15 AM  Advanced Directives  Does Patient Have a Medical Advance Directive? No No No Patient would like information  Would patient like information on creating a medical advance directive?   No - Patient declined Advance directive packet given    Current  Medications (verified) Outpatient Encounter Medications as of 06/29/2023  Medication Sig   CALCIUM PO Take by mouth.   Cholecalciferol (VITAMIN D -3 PO) Take by mouth.   EPINEPHrine  0.3 mg/0.3 mL IJ SOAJ injection Inject 0.3 mg into the muscle as needed for anaphylaxis.   MAGNESIUM PO Take by mouth.   Menaquinone-7 (VITAMIN K2 PO) Take by mouth.   Multiple Vitamins-Minerals (HAIR SKIN & NAILS PO) Take by mouth.   Omega-3 Fatty Acids (FISH OIL PO) Take by mouth.   risedronate  (ACTONEL ) 150 MG tablet Take 1 tablet (150 mg total) by mouth every 30 (thirty) days. with water  on empty stomach, nothing by mouth or lie down for next 30 minutes.   No facility-administered encounter medications on file as of 06/29/2023.    Allergies (verified) Bee venom, Elemental sulfur, and Penicillins   History: Past Medical History:  Diagnosis Date   Allergy    bee stings, sulfur, penicillin   Dysplasia of cervix, unspecified 80's   History of kidney stones    Osteoporosis    Shingles 12/08/2014   Vaginal Pap smear, abnormal    Past Surgical History:  Procedure Laterality Date   BREAST BIOPSY Left    benign   CATARACT EXTRACTION     COLONOSCOPY N/A 09/15/2012   Procedure: COLONOSCOPY;  Surgeon: Ruby Corporal, MD;  Location: [facility];  Service: Endoscopy;  Laterality: N/A;  915   COLONOSCOPY WITH PROPOFOL  N/A 10/09/2022   Procedure: COLONOSCOPY WITH PROPOFOL ;  Surgeon: Urban Garden, MD;  Location: [facility];  Service:  Gastroenterology;  Laterality: N/A;  730am, asa 2   EYE SURGERY     HAMMER TOE SURGERY     LASER ABLATION OF THE CERVIX  1985   laser conization     WRIST SURGERY     Family History  Problem Relation Age of Onset   Hypertension Mother    Stroke Mother    COPD Father    Cancer Sister 22       cervical cancer   Cancer Maternal Uncle    Cancer Paternal Aunt    COPD Paternal Aunt    Cancer Paternal Uncle    COPD Paternal Uncle    Cancer Paternal Uncle     Social History   Socioeconomic History   Marital status: Married    Spouse name: Not on file   Number of children: 0   Years of education: Not on file   Highest education level: Some college, no degree  Occupational History   Not on file  Tobacco Use   Smoking status: Never   Smokeless tobacco: Never  Vaping Use   Vaping status: Never Used  Substance and Sexual Activity   Alcohol use: Never   Drug use: Never   Sexual activity: Not Currently    Birth control/protection: Post-menopausal  Other Topics Concern   Not on file  Social History Narrative   Not on file   Social Drivers of Health   Financial Resource Strain: Low Risk  (06/26/2023)   Overall Financial Resource Strain (CARDIA)    Difficulty of Paying Living Expenses: Not hard at all  Food Insecurity: No Food Insecurity (06/29/2023)   Hunger Vital Sign    Worried About Running Out of Food in the Last Year: Never true    Ran Out of Food in the Last Year: Never true  Transportation Needs: No Transportation Needs (06/29/2023)   PRAPARE - Administrator, Civil Service (Medical): No    Lack of Transportation (Non-Medical): No  Physical Activity: Sufficiently Active (06/26/2023)   Exercise Vital Sign    Days of Exercise per Week: 6 days    Minutes of Exercise per Session: 30 min  Stress: No Stress Concern Present (06/26/2023)   [employer] of Occupational Health - Occupational Stress Questionnaire    Feeling of Stress : Not at all  Social Connections: Socially Integrated (06/29/2023)   Social Connection and Isolation Panel [NHANES]    Frequency of Communication with Friends and Family: More than three times a week    Frequency of Social Gatherings with Friends and Family: More than three times a week    Attends Religious Services: More than 4 times per year    Active Member of [employer] or Organizations: Yes    Attends Engineer, structural: More than 4 times per year    Marital Status: Married     Tobacco Counseling Counseling given: Yes    Clinical Intake:  Pre-visit preparation completed: Yes  Pain : No/denies pain     BMI - recorded: 29.1 Nutritional Status: BMI 25 -29 Overweight Nutritional Risks: None Diabetes: No  No results found for: "HGBA1C"   How often do you need to have someone help you when you read instructions, pamphlets, or other written materials from your doctor or pharmacy?: 1 - Never  Interpreter Needed?: No  Information entered by :: Alia t/cma   Activities of Daily Living     06/26/2023    2:18 AM  In your present state of health, do you  have any difficulty performing the following activities:  Hearing? 0  Vision? 0  Difficulty concentrating or making decisions? 0  Walking or climbing stairs? 0  Dressing or bathing? 0  Doing errands, shopping? 0  Preparing Food and eating ? N  Using the Toilet? N  In the past six months, have you accidently leaked urine? Y  Do you have problems with loss of bowel control? N  Managing your Medications? N  Managing your Finances? N  Housekeeping or managing your Housekeeping? N    Patient Care Team: Roise Cleaver, MD as PCP - General (Family Medicine)  Indicate any recent Medical Services you may have received from other than [facility] providers in the past year (date may be approximate).     Assessment:    This is a routine wellness examination for Roberta Barrett.  Hearing/Vision screen Hearing Screening - Comments:: Pt denies hearing dif Vision Screening - Comments:: Pt denies vision dif/pt goes to Dr. Candi Chafe in [place] in [place]   Goals Addressed             This Visit's Progress    DIET - INCREASE WATER  INTAKE   On track      Depression Screen     06/29/2023    9:59 AM 01/01/2023    8:34 AM 01/01/2023    8:33 AM 12/26/2022    9:44 AM 06/26/2022    9:20 AM 04/24/2022    9:12 AM 12/23/2021    2:13 PM  PHQ 2/9 Scores  PHQ - 2 Score 0 0 0 0 0 0 0  PHQ- 9 Score 0   0   0    Fall Risk      06/26/2023    2:18 AM 01/01/2023    8:34 AM 01/01/2023    8:33 AM 12/26/2022    9:49 AM 07/15/2022    9:55 AM  Fall Risk   Falls in the past year? 0 0 0 0 0  Number falls in past yr:    0 0  Injury with Fall?    0 0  Risk for fall due to :    No Fall Risks   Follow up    Falls evaluation completed     MEDICARE RISK AT HOME:  Medicare Risk at Home Any stairs in or around the home?: (Patient-Rptd) Yes If so, are there any without handrails?: (Patient-Rptd) No Home free of loose throw rugs in walkways, pet beds, electrical cords, etc?: (Patient-Rptd) Yes Adequate lighting in your home to reduce risk of falls?: (Patient-Rptd) Yes Life alert?: (Patient-Rptd) No Use of a cane, walker or w/c?: (Patient-Rptd) No Grab bars in the bathroom?: (Patient-Rptd) Yes Shower chair or bench in shower?: (Patient-Rptd) Yes Elevated toilet seat or a handicapped toilet?: (Patient-Rptd) No  TIMED UP AND GO:  Was the test performed?  no  Cognitive Function: 6CIT completed        06/29/2023   10:00 AM 06/26/2022    9:22 AM  6CIT Screen  What Year? 0 points 0 points  What month? 0 points 0 points  What time? 0 points 0 points  Count back from 20 0 points 0 points  Months in reverse 0 points 0 points  Repeat phrase 0 points 0 points  Total Score 0 points 0 points    Immunizations Immunization History  Administered Date(s) Administered   Influenza,inj,Quad PF,6+ Mos 02/12/2017   Moderna Sars-Covid-2 Vaccination 06/09/2019, 07/07/2019   Tdap 09/16/2018   Zoster Recombinant(Shingrix ) 09/23/2017, 12/01/2017    Screening  Tests Health Maintenance  Topic Date Due   Pneumonia Vaccine 30+ Years old (1 of 1 - PCV) Never done   COVID-19 Vaccine (3 - 2024-25 season) 07/14/2024 (Originally 10/12/2022)   INFLUENZA VACCINE  09/11/2023   DEXA SCAN  01/08/2024   MAMMOGRAM  05/07/2024   Medicare Annual Wellness (AWV)  06/28/2024   DTaP/Tdap/Td (2 - Td or Tdap) 09/15/2028   Colonoscopy  10/08/2032    Hepatitis C Screening  Completed   Zoster Vaccines- Shingrix   Completed   HPV VACCINES  Aged Out   Meningococcal B Vaccine  Aged Out    Health Maintenance  Health Maintenance Due  Topic Date Due   Pneumonia Vaccine 50+ Years old (1 of 1 - PCV) Never done   Health Maintenance Items Addressed: See Nurse Notes  Additional Screening:  Vision Screening: Recommended annual ophthalmology exams for early detection of glaucoma and other disorders of the eye.  Dental Screening: Recommended annual dental exams for proper oral hygiene  Community Resource Referral / Chronic Care Management: CRR required this visit?  Yes   CCM required this visit?  No   Plan:    I have personally reviewed and noted the following in the patient's chart:   Medical and social history Use of alcohol, tobacco or illicit drugs  Current medications and supplements including opioid prescriptions. Patient is not currently taking opioid prescriptions. Functional ability and status Nutritional status Physical activity Advanced directives List of other physicians Hospitalizations, surgeries, and ER visits in previous 12 months Vitals Screenings to include cognitive, depression, and falls Referrals and appointments  In addition, I have reviewed and discussed with patient certain preventive protocols, quality metrics, and best practice recommendations. A written personalized care plan for preventive services as well as general preventive health recommendations were provided to patient.   Michaelle Adolphus, CMA   06/29/2023   After Visit Summary: (MyChart) Due to this being a telephonic visit, the after visit summary with patients personalized plan was offered to patient via MyChart   Notes: FYI PCP: pt is aware/encourage to get Pneumonia vaccine, per pt don't want the the vaccine.

## 2023-06-29 NOTE — Patient Instructions (Signed)
 Roberta Barrett , Thank you for taking time out of your busy schedule to complete your Annual Wellness Visit with me. I enjoyed our conversation and look forward to speaking with you again next year. I, as well as your care team,  appreciate your ongoing commitment to your health goals. Please review the following plan we discussed and let me know if I can assist you in the future. Your Game plan/ To Do List    Follow up Visits: Next Medicare AWV with our clinical staff: Wed., 06/29/24 at 10:00a.m.   Next Office Visit with your provider: 07/01/23 at 9:55a.m.  Clinician Recommendations:  Aim for 30 minutes of exercise or brisk walking, 6-8 glasses of water , and 5 servings of fruits and vegetables each day. N/a      This is a list of the screening recommended for you and due dates:  Health Maintenance  Topic Date Due   Pneumonia Vaccine (1 of 1 - PCV) Never done   COVID-19 Vaccine (3 - 2024-25 season) 07/14/2024*   Flu Shot  09/11/2023   DEXA scan (bone density measurement)  01/08/2024   Mammogram  05/07/2024   Medicare Annual Wellness Visit  06/28/2024   DTaP/Tdap/Td vaccine (2 - Td or Tdap) 09/15/2028   Colon Cancer Screening  10/08/2032   Hepatitis C Screening  Completed   Zoster (Shingles) Vaccine  Completed   HPV Vaccine  Aged Out   Meningitis B Vaccine  Aged Out  *Topic was postponed. The date shown is not the original due date.    Advanced directives: (Declined) Advance directive discussed with you today. Even though you declined this today, please call our office should you change your mind, and we can give you the proper paperwork for you to fill out. Advance Care Planning is important because it:  [x]  Makes sure you receive the medical care that is consistent with your values, goals, and preferences  [x]  It provides guidance to your family and loved ones and reduces their decisional burden about whether or not they are making the right decisions based on your wishes.  Follow  the link provided in your after visit summary or read over the paperwork we have mailed to you to help you started getting your Advance Directives in place. If you need assistance in completing these, please reach out to us  so that we can help you!  See attachments for Preventive Care and Fall Prevention Tips.

## 2023-07-01 ENCOUNTER — Encounter: Payer: Self-pay | Admitting: Family Medicine

## 2023-07-01 ENCOUNTER — Ambulatory Visit (INDEPENDENT_AMBULATORY_CARE_PROVIDER_SITE_OTHER): Payer: Medicare HMO | Admitting: Family Medicine

## 2023-07-01 VITALS — BP 156/81 | HR 86 | Temp 97.5°F | Ht 61.0 in | Wt 145.0 lb

## 2023-07-01 DIAGNOSIS — R030 Elevated blood-pressure reading, without diagnosis of hypertension: Secondary | ICD-10-CM

## 2023-07-01 DIAGNOSIS — E559 Vitamin D deficiency, unspecified: Secondary | ICD-10-CM | POA: Diagnosis not present

## 2023-07-01 DIAGNOSIS — Z0000 Encounter for general adult medical examination without abnormal findings: Secondary | ICD-10-CM

## 2023-07-01 DIAGNOSIS — E7800 Pure hypercholesterolemia, unspecified: Secondary | ICD-10-CM

## 2023-07-01 DIAGNOSIS — Z0001 Encounter for general adult medical examination with abnormal findings: Secondary | ICD-10-CM

## 2023-07-01 NOTE — Progress Notes (Unsigned)
 Subjective:  Patient ID: Roberta Barrett, female    DOB: 1955-04-08  Age: 68 y.o. MRN: [id number]  CC: Annual Exam (NO CONCERNS/DECLINED BREAST EXAM)   HPI Roberta Barrett presents for complete annual exam without gynecological component.  She has been followed for her blood pressure and cholesterol, however neither have at this point required treatment.  She does use omega-3 fatty acids.  Roberta Barrett does have a bee venom allergy for which she keeps EpiPen  available.  There has been no episode recently.  She uses risedronate  monthly for her osteopenia.  That is managed by gynecology.     06/29/2023    9:59 AM 01/01/2023    8:34 AM 01/01/2023    8:33 AM  Depression screen PHQ 2/9  Decreased Interest 0 0 0  Down, Depressed, Hopeless 0 0 0  PHQ - 2 Score 0 0 0  Altered sleeping 0    Tired, decreased energy 0    Change in appetite 0    Feeling bad or failure about yourself  0    Trouble concentrating 0    Moving slowly or fidgety/restless 0    Suicidal thoughts 0    PHQ-9 Score 0    Difficult doing work/chores Not difficult at all      History Roberta Barrett has a past medical history of Allergy, Dysplasia of cervix, unspecified (80's), History of kidney stones, Osteoporosis, Shingles (12/08/2014), and Vaginal Pap smear, abnormal.   She has a past surgical history that includes laser conization; Laser ablation of the cervix (1985); Colonoscopy (N/A, 09/15/2012); Hammer toe surgery; Breast biopsy (Left); Wrist surgery; Eye surgery; Cataract extraction; and Colonoscopy with propofol  (N/A, 10/09/2022).   Her family history includes COPD in her father, paternal aunt, and paternal uncle; Cancer in her maternal uncle, paternal aunt, paternal uncle, and paternal uncle; Cancer (age of onset: 77) in her sister; Hypertension in her mother; Stroke in her mother.She reports that she has never smoked. She has never used smokeless tobacco. She reports that she does not drink alcohol and does not use  drugs.    ROS Review of Systems  Constitutional:  Negative for appetite change, chills, diaphoresis, fatigue, fever and unexpected weight change.  HENT:  Negative for congestion, ear pain, hearing loss, postnasal drip, rhinorrhea, sneezing, sore throat and trouble swallowing.   Eyes:  Negative for pain.  Respiratory:  Negative for cough, chest tightness and shortness of breath.   Cardiovascular:  Negative for chest pain and palpitations.  Gastrointestinal:  Negative for abdominal pain, constipation, diarrhea, nausea and vomiting.  Endocrine: Negative for cold intolerance, heat intolerance, polydipsia, polyphagia and polyuria.  Genitourinary:  Negative for dysuria, frequency and menstrual problem.  Musculoskeletal:  Negative for arthralgias and joint swelling.  Skin:  Negative for rash.  Allergic/Immunologic: Negative for environmental allergies.  Neurological:  Negative for dizziness, weakness, numbness and headaches.  Psychiatric/Behavioral:  Negative for agitation and dysphoric mood.     Objective:  BP (!) 156/81   Pulse 86   Temp (!) 97.5 F (36.4 C)   Ht 5\' 1"  (1.549 m)   Wt 145 lb (65.8 kg)   SpO2 100%   BMI 27.40 kg/m   BP Readings from Last 3 Encounters:  07/01/23 (!) 156/81  06/29/23 (!) 169/73  04/16/23 (!) 169/73    Wt Readings from Last 3 Encounters:  07/01/23 145 lb (65.8 kg)  06/29/23 154 lb (69.9 kg)  01/01/23 154 lb 6.4 oz (70 kg)     Physical Exam Constitutional:  General: She is not in acute distress.    Appearance: She is well-developed.  HENT:     Head: Normocephalic and atraumatic.  Eyes:     Conjunctiva/sclera: Conjunctivae normal.     Pupils: Pupils are equal, round, and reactive to light.  Neck:     Thyroid: No thyromegaly.  Cardiovascular:     Rate and Rhythm: Normal rate and regular rhythm.     Heart sounds: Normal heart sounds. No murmur heard. Pulmonary:     Effort: Pulmonary effort is normal. No respiratory distress.      Breath sounds: Normal breath sounds. No wheezing or rales.  Abdominal:     General: Bowel sounds are normal. There is no distension.     Palpations: Abdomen is soft.     Tenderness: There is no abdominal tenderness.  Musculoskeletal:        General: Normal range of motion.     Cervical back: Normal range of motion and neck supple.  Lymphadenopathy:     Cervical: No cervical adenopathy.  Skin:    General: Skin is warm and dry.  Neurological:     Mental Status: She is alert and oriented to person, place, and time.  Psychiatric:        Behavior: Behavior normal.        Thought Content: Thought content normal.        Judgment: Judgment normal.      Assessment & Plan:  Well adult exam -     CBC with Differential/Platelet -     CMP14+EGFR -     Lipid panel  Elevated cholesterol -     CMP14+EGFR -     Lipid panel  Elevated BP without diagnosis of hypertension -     CBC with Differential/Platelet -     CMP14+EGFR -     Lipid panel  Vitamin D  deficiency -     VITAMIN D  25 Hydroxy (Vit-D Deficiency, Fractures)     Follow-up: Return in about 1 year (around 06/30/2024) for Compete physical.  Roberta Barrett, M.D.

## 2023-07-01 NOTE — Patient Instructions (Signed)

## 2023-07-02 ENCOUNTER — Ambulatory Visit: Payer: Self-pay | Admitting: Family Medicine

## 2023-07-02 LAB — CBC WITH DIFFERENTIAL/PLATELET
Basophils Absolute: 0.1 10*3/uL (ref 0.0–0.2)
Basos: 2 %
EOS (ABSOLUTE): 0.1 10*3/uL (ref 0.0–0.4)
Eos: 3 %
Hematocrit: 42.5 % (ref 34.0–46.6)
Hemoglobin: 14.4 g/dL (ref 11.1–15.9)
Immature Grans (Abs): 0.1 10*3/uL (ref 0.0–0.1)
Immature Granulocytes: 1 %
Lymphocytes Absolute: 1.6 10*3/uL (ref 0.7–3.1)
Lymphs: 36 %
MCH: 28.8 pg (ref 26.6–33.0)
MCHC: 33.9 g/dL (ref 31.5–35.7)
MCV: 85 fL (ref 79–97)
Monocytes Absolute: 0.4 10*3/uL (ref 0.1–0.9)
Monocytes: 9 %
Neutrophils Absolute: 2.3 10*3/uL (ref 1.4–7.0)
Neutrophils: 49 %
Platelets: 145 10*3/uL — ABNORMAL LOW (ref 150–450)
RBC: 5 x10E6/uL (ref 3.77–5.28)
RDW: 13 % (ref 11.7–15.4)
WBC: 4.5 10*3/uL (ref 3.4–10.8)

## 2023-07-02 LAB — CMP14+EGFR
ALT: 11 IU/L (ref 0–32)
AST: 19 IU/L (ref 0–40)
Albumin: 4.6 g/dL (ref 3.9–4.9)
Alkaline Phosphatase: 56 IU/L (ref 44–121)
BUN/Creatinine Ratio: 15 (ref 12–28)
BUN: 15 mg/dL (ref 8–27)
Bilirubin Total: 0.5 mg/dL (ref 0.0–1.2)
CO2: 23 mmol/L (ref 20–29)
Calcium: 9.3 mg/dL (ref 8.7–10.3)
Chloride: 102 mmol/L (ref 96–106)
Creatinine, Ser: 0.99 mg/dL (ref 0.57–1.00)
Globulin, Total: 2.9 g/dL (ref 1.5–4.5)
Glucose: 89 mg/dL (ref 70–99)
Potassium: 4.6 mmol/L (ref 3.5–5.2)
Sodium: 141 mmol/L (ref 134–144)
Total Protein: 7.5 g/dL (ref 6.0–8.5)
eGFR: 63 mL/min/{1.73_m2} (ref >59)

## 2023-07-02 LAB — LIPID PANEL
Chol/HDL Ratio: 3.8 ratio (ref 0.0–4.4)
Cholesterol, Total: 218 mg/dL — ABNORMAL HIGH (ref 100–199)
HDL: 58 mg/dL (ref >39)
LDL Chol Calc (NIH): 138 mg/dL — ABNORMAL HIGH (ref 0–99)
Triglycerides: 124 mg/dL (ref 0–149)
VLDL Cholesterol Cal: 22 mg/dL (ref 5–40)

## 2023-07-02 LAB — VITAMIN D 25 HYDROXY (VIT D DEFICIENCY, FRACTURES): Vit D, 25-Hydroxy: 56.7 ng/mL (ref 30.0–100.0)

## 2023-08-20 ENCOUNTER — Ambulatory Visit (INDEPENDENT_AMBULATORY_CARE_PROVIDER_SITE_OTHER)

## 2023-08-20 NOTE — Progress Notes (Addendum)
 Patient is in office today for a nurse visit for Blood Pressure Check. Patient blood pressure was 159/82, Patient No chest pain. Checked patients blood pressure with home machine, reading was 160/94. Checked blood pressure again, reading was 160/86. Patient has been checking blood pressure at home several times a day, blood pressure readings at home are 120's/70's. Patient is not currently having any symptoms.

## 2023-08-27 ENCOUNTER — Ambulatory Visit (INDEPENDENT_AMBULATORY_CARE_PROVIDER_SITE_OTHER): Admitting: Family Medicine

## 2023-08-27 ENCOUNTER — Encounter: Payer: Self-pay | Admitting: Family Medicine

## 2023-08-27 VITALS — BP 130/74 | HR 65 | Temp 96.1°F | Ht 61.0 in | Wt 146.0 lb

## 2023-08-27 DIAGNOSIS — R0982 Postnasal drip: Secondary | ICD-10-CM | POA: Diagnosis not present

## 2023-08-27 DIAGNOSIS — R490 Dysphonia: Secondary | ICD-10-CM | POA: Diagnosis not present

## 2023-08-27 DIAGNOSIS — J301 Allergic rhinitis due to pollen: Secondary | ICD-10-CM | POA: Diagnosis not present

## 2023-08-27 MED ORDER — LEVOCETIRIZINE DIHYDROCHLORIDE 5 MG PO TABS: 5.0000 mg | ORAL_TABLET | Freq: Every evening | ORAL | 0 refills | Status: AC

## 2023-08-27 NOTE — Progress Notes (Signed)
 Subjective:  Patient ID: Roberta Barrett, female    DOB: November 26, 1955, 68 y.o.   MRN: [id number]  Patient Care Team: Zollie Lowers, MD as PCP - General (Family Medicine)   Chief Complaint:  Hoarse (X 5 days )   HPI: Roberta Barrett is a 68 y.o. female presenting on 08/27/2023 for Hoarse (X 5 days )   Roberta Barrett is a 68 year old female who presents with throat irritation and hoarseness.  She has been experiencing throat irritation and hoarseness since the weekend, coinciding with the onset of cold symptoms. She is concerned about the appearance of her throat.  She has had nasal congestion for about a month, which she attributes to allergies. She takes Claritin, one tablet nightly between 7 and 8 PM, but reports no significant relief. Ricola lozenges provide some relief, although her throat is not sore. No trouble swallowing, significant cough, or ear discomfort. Occasional coughing occurs but is not frequent or bothersome.  No smoking, excessive caffeine use, or acid reflux. She has not been around anyone known to be sick recently.          Relevant past medical, surgical, family, and social history reviewed and updated as indicated.  Allergies and medications reviewed and updated. Data reviewed: Chart in Epic.   Past Medical History:  Diagnosis Date   Allergy    bee stings, sulfur, penicillin   Dysplasia of cervix, unspecified 80's   History of kidney stones    Osteoporosis    Shingles 12/08/2014   Vaginal Pap smear, abnormal     Past Surgical History:  Procedure Laterality Date   BREAST BIOPSY Left    benign   CATARACT EXTRACTION     COLONOSCOPY N/A 09/15/2012   Procedure: COLONOSCOPY;  Surgeon: Claudis RAYMOND Rivet, MD;  Location: [facility];  Service: Endoscopy;  Laterality: N/A;  915   COLONOSCOPY WITH PROPOFOL  N/A 10/09/2022   Procedure: COLONOSCOPY WITH PROPOFOL ;  Surgeon: Eartha Angelia Sieving, MD;  Location: [facility];  Service:  Gastroenterology;  Laterality: N/A;  730am, asa 2   EYE SURGERY     HAMMER TOE SURGERY     LASER ABLATION OF THE CERVIX  1985   laser conization     WRIST SURGERY      Social History   Socioeconomic History   Marital status: Married    Spouse name: Not on file   Number of children: 0   Years of education: Not on file   Highest education level: Some college, no degree  Occupational History   Not on file  Tobacco Use   Smoking status: Never   Smokeless tobacco: Never  Vaping Use   Vaping status: Never Used  Substance and Sexual Activity   Alcohol use: Never   Drug use: Never   Sexual activity: Not Currently    Birth control/protection: Post-menopausal  Other Topics Concern   Not on file  Social History Narrative   Not on file   Social Drivers of Health   Financial Resource Strain: Low Risk  (06/26/2023)   Overall Financial Resource Strain (CARDIA)    Difficulty of Paying Living Expenses: Not hard at all  Food Insecurity: No Food Insecurity (06/29/2023)   Hunger Vital Sign    Worried About Running Out of Food in the Last Year: Never true    Ran Out of Food in the Last Year: Never true  Transportation Needs: No Transportation Needs (06/29/2023)   PRAPARE - Transportation  Lack of Transportation (Medical): No    Lack of Transportation (Non-Medical): No  Physical Activity: Sufficiently Active (06/26/2023)   Exercise Vital Sign    Days of Exercise per Week: 6 days    Minutes of Exercise per Session: 30 min  Stress: No Stress Concern Present (06/26/2023)   [employer] of Occupational Health - Occupational Stress Questionnaire    Feeling of Stress : Not at all  Social Connections: Socially Integrated (06/29/2023)   Social Connection and Isolation Panel    Frequency of Communication with Friends and Family: More than three times a week    Frequency of Social Gatherings with Friends and Family: More than three times a week    Attends Religious Services: More than 4  times per year    Active Member of [employer] or Organizations: Yes    Attends Banker Meetings: More than 4 times per year    Marital Status: Married  Catering manager Violence: Not At Risk (06/29/2023)   Humiliation, Afraid, Rape, and Kick questionnaire    Fear of Current or Ex-Partner: No    Emotionally Abused: No    Physically Abused: No    Sexually Abused: No    Outpatient Encounter Medications as of 08/27/2023  Medication Sig   CALCIUM PO Take by mouth.   Cholecalciferol (VITAMIN D -3 PO) Take by mouth.   EPINEPHrine  0.3 mg/0.3 mL IJ SOAJ injection Inject 0.3 mg into the muscle as needed for anaphylaxis.   levocetirizine (XYZAL ) 5 MG tablet Take 1 tablet (5 mg total) by mouth every evening.   MAGNESIUM PO Take by mouth.   Menaquinone-7 (VITAMIN K2 PO) Take by mouth.   Multiple Vitamins-Minerals (HAIR SKIN & NAILS PO) Take by mouth.   Omega-3 Fatty Acids (FISH OIL PO) Take by mouth.   risedronate  (ACTONEL ) 150 MG tablet Take 1 tablet (150 mg total) by mouth every 30 (thirty) days. with water  on empty stomach, nothing by mouth or lie down for next 30 minutes.   No facility-administered encounter medications on file as of 08/27/2023.    Allergies  Allergen Reactions   Bee Venom Hives, Shortness Of Breath and Swelling   Elemental Sulfur Rash   Penicillins Rash    Pertinent ROS per HPI, otherwise unremarkable      Objective:  BP 130/74 Comment: BP at home before apt  Pulse 65   Temp (!) 96.1 F (35.6 C)   Ht 5' 1 (1.549 m)   Wt 146 lb (66.2 kg)   SpO2 100%   BMI 27.59 kg/m    Wt Readings from Last 3 Encounters:  08/27/23 146 lb (66.2 kg)  07/01/23 145 lb (65.8 kg)  06/29/23 154 lb (69.9 kg)    Physical Exam Vitals and nursing note reviewed.  Constitutional:      General: She is not in acute distress.    Appearance: Normal appearance. She is not ill-appearing, toxic-appearing or diaphoretic.  HENT:     Head: Normocephalic and atraumatic.     Right  Ear: Tympanic membrane, ear canal and external ear normal.     Left Ear: Tympanic membrane, ear canal and external ear normal.     Nose: Rhinorrhea present.     Mouth/Throat:     Lips: Pink.     Mouth: Mucous membranes are moist.     Pharynx: Postnasal drip present. No oropharyngeal exudate or posterior oropharyngeal erythema.     Comments: Cobblestoning to posterior oropharynx  Eyes:     Conjunctiva/sclera: Conjunctivae normal.  Pupils: Pupils are equal, round, and reactive to light.  Neck:     Trachea: Trachea and phonation normal.  Cardiovascular:     Rate and Rhythm: Normal rate and regular rhythm.     Heart sounds: Normal heart sounds.  Pulmonary:     Effort: Pulmonary effort is normal.     Breath sounds: Normal breath sounds.  Musculoskeletal:     Cervical back: Neck supple.  Lymphadenopathy:     Cervical: No cervical adenopathy.  Skin:    General: Skin is warm and dry.     Capillary Refill: Capillary refill takes less than 2 seconds.  Neurological:     General: No focal deficit present.     Mental Status: She is alert and oriented to person, place, and time.  Psychiatric:        Mood and Affect: Mood normal.        Behavior: Behavior normal.        Thought Content: Thought content normal.        Judgment: Judgment normal.    Results for orders placed or performed in visit on 07/01/23  CBC with Differential/Platelet   Collection Time: 07/01/23  1:41 PM  Result Value Ref Range   WBC 4.5 3.4 - 10.8 x10E3/uL   RBC 5.00 3.77 - 5.28 x10E6/uL   Hemoglobin 14.4 11.1 - 15.9 g/dL   Hematocrit 57.4 65.9 - 46.6 %   MCV 85 79 - 97 fL   MCH 28.8 26.6 - 33.0 pg   MCHC 33.9 31.5 - 35.7 g/dL   RDW 86.9 88.2 - 84.5 %   Platelets 145 (L) 150 - 450 x10E3/uL   Neutrophils 49 Not Estab. %   Lymphs 36 Not Estab. %   Monocytes 9 Not Estab. %   Eos 3 Not Estab. %   Basos 2 Not Estab. %   Neutrophils Absolute 2.3 1.4 - 7.0 x10E3/uL   Lymphocytes Absolute 1.6 0.7 - 3.1 x10E3/uL    Monocytes Absolute 0.4 0.1 - 0.9 x10E3/uL   EOS (ABSOLUTE) 0.1 0.0 - 0.4 x10E3/uL   Basophils Absolute 0.1 0.0 - 0.2 x10E3/uL   Immature Granulocytes 1 Not Estab. %   Immature Grans (Abs) 0.1 0.0 - 0.1 x10E3/uL  CMP14+EGFR   Collection Time: 07/01/23  1:41 PM  Result Value Ref Range   Glucose 89 70 - 99 mg/dL   BUN 15 8 - 27 mg/dL   Creatinine, Ser 9.00 0.57 - 1.00 mg/dL   eGFR 63 >40 fO/fpw/8.26   BUN/Creatinine Ratio 15 12 - 28   Sodium 141 134 - 144 mmol/L   Potassium 4.6 3.5 - 5.2 mmol/L   Chloride 102 96 - 106 mmol/L   CO2 23 20 - 29 mmol/L   Calcium 9.3 8.7 - 10.3 mg/dL   Total Protein 7.5 6.0 - 8.5 g/dL   Albumin 4.6 3.9 - 4.9 g/dL   Globulin, Total 2.9 1.5 - 4.5 g/dL   Bilirubin Total 0.5 0.0 - 1.2 mg/dL   Alkaline Phosphatase 56 44 - 121 IU/L   AST 19 0 - 40 IU/L   ALT 11 0 - 32 IU/L  Lipid panel   Collection Time: 07/01/23  1:41 PM  Result Value Ref Range   Cholesterol, Total 218 (H) 100 - 199 mg/dL   Triglycerides 875 0 - 149 mg/dL   HDL 58 >60 mg/dL   VLDL Cholesterol Cal 22 5 - 40 mg/dL   LDL Chol Calc (NIH) 861 (H) 0 - 99 mg/dL  Chol/HDL Ratio 3.8 0.0 - 4.4 ratio  VITAMIN D  25 Hydroxy (Vit-D Deficiency, Fractures)   Collection Time: 07/01/23  1:41 PM  Result Value Ref Range   Vit D, 25-Hydroxy 56.7 30.0 - 100.0 ng/mL       Pertinent labs & imaging results that were available during my care of the patient were reviewed by me and considered in my medical decision making.  Assessment & Plan:  Roberta Barrett was seen today for hoarse.  Diagnoses and all orders for this visit:  Post-nasal drainage -     levocetirizine (XYZAL ) 5 MG tablet; Take 1 tablet (5 mg total) by mouth every evening.  Hoarseness -     levocetirizine (XYZAL ) 5 MG tablet; Take 1 tablet (5 mg total) by mouth every evening.  Seasonal allergic rhinitis due to pollen -     levocetirizine (XYZAL ) 5 MG tablet; Take 1 tablet (5 mg total) by mouth every evening.      Allergic Rhinitis with  Laryngitis Symptoms consistent with allergic rhinitis, including nasal drainage and postnasal drip, have led to cobblestoning in the throat and mild laryngitis, causing hoarseness. Symptoms have persisted for about a month. Claritin and Ricola lozenges have provided some relief. Cobblestoning is a classic sign of postnasal drainage due to allergies and is not indicative of cancer. Xyzal  is recommended as an alternative to Claritin, with expected symptom improvement in 4-5 days. Voice rest and warm liquids with honey are advised to aid in recovery. - Discontinue Claritin - Start Xyzal  every night - Use Cepacol lozenges for dry throat and hoarseness - Encourage voice rest and warm liquids with honey - Avoid spicy foods - Increase water  intake - Reassess if no improvement in 4-5 days          Continue all other maintenance medications.  Follow up plan: Return if symptoms worsen or fail to improve.   Continue healthy lifestyle choices, including diet (rich in fruits, vegetables, and lean proteins, and low in salt and simple carbohydrates) and exercise (at least 30 minutes of moderate physical activity daily).  Educational handout given for laryngitis   The above assessment and management plan was discussed with the patient. The patient verbalized understanding of and has agreed to the management plan. Patient is aware to call the clinic if they develop any new symptoms or if symptoms persist or worsen. Patient is aware when to return to the clinic for a follow-up visit. Patient educated on when it is appropriate to go to the emergency department.   Roberta Bruns, FNP-C [facility] [phone]

## 2023-10-05 ENCOUNTER — Encounter: Payer: Self-pay | Admitting: Family Medicine

## 2023-10-05 DIAGNOSIS — J069 Acute upper respiratory infection, unspecified: Secondary | ICD-10-CM

## 2023-10-06 MED ORDER — DOXYCYCLINE HYCLATE 100 MG PO TABS: 100.0000 mg | ORAL_TABLET | Freq: Two times a day (BID) | ORAL | 0 refills | Status: AC

## 2023-11-25 ENCOUNTER — Encounter (INDEPENDENT_AMBULATORY_CARE_PROVIDER_SITE_OTHER): Payer: Self-pay | Admitting: Gastroenterology

## 2023-12-14 ENCOUNTER — Ambulatory Visit: Admitting: Podiatry

## 2023-12-14 ENCOUNTER — Other Ambulatory Visit: Payer: Self-pay | Admitting: Podiatry

## 2023-12-14 ENCOUNTER — Ambulatory Visit

## 2023-12-14 DIAGNOSIS — M21612 Bunion of left foot: Secondary | ICD-10-CM | POA: Diagnosis not present

## 2023-12-14 DIAGNOSIS — M21611 Bunion of right foot: Secondary | ICD-10-CM | POA: Diagnosis not present

## 2023-12-14 DIAGNOSIS — M21619 Bunion of unspecified foot: Secondary | ICD-10-CM

## 2023-12-14 NOTE — Patient Instructions (Signed)
 Bunion: What to Know A bunion, or hallux valgus, is a bump that forms slowly on the inner side of your big toe joint. It happens when your big toe turns toward your second toe. Bunions may be small at first but get bigger over time. They can make walking painful. What are the causes? A bunion may be caused by: Wearing narrow or pointed shoes that force your big toe to press against the other toes. Problems with how your foot is shaped. Changes in your foot caused by some diseases or conditions. A foot injury. What increases the risk? You're more likely to get a bunion if: You wear shoes that squeeze your toes. You have certain diseases, such as: Rheumatoid arthritis. Cerebral palsy. Someone in your family gets bunions too. You have flat feet or low arches. You do things that put a lot of pressure on your feet, such as ballet. What are the signs or symptoms? The main symptom is a bump on the inner side of your big toe. You may also have: Pain. Redness and swelling around your big toe. Thick or hard skin on your big toe or between your toes. Stiffness or loss of movement in your big toe. Trouble walking. How is this diagnosed? A bunion may be diagnosed based on your symptoms, medical history, and activities.  You may also have tests, such as an X-ray. This helps your health care provider see the bones in your foot and look for damage to your joint. How is this treated? Treatment can help with symptoms and can stop the bunion from getting worse. What you need to do may depend on how bad your symptoms are. You may need to: Wear shoes that have a wide toe box. Use bunion pads to cushion your toes. Tape your toes together. Place an insert called an orthotic device in your shoe. This can help take pressure off your toe joint. Take medicine to help with pain and swelling. Put ice or heat on your foot. Do stretching exercises. Have surgery. You may need this if the bunion is causing very  bad symptoms. Follow these instructions at home: Managing pain, stiffness, and swelling     Use ice or an ice pack as told. Place a towel between your skin and the ice. Leave the ice on for 20 minutes, 2-3 times a day. Use heat as told. Use the heat source that your provider recommends, such as a moist heat pack or a heating pad. Do this as often as told. Place a towel between your skin and the heat source. Leave the heat on for 20-30 minutes. If your skin turns red, take off the ice or heat right away to prevent skin damage. The risk of damage is higher if you can't feel pain, heat, or cold. General instructions Exercise as told. Support your toe joint as told with: The right footwear. Shoe padding. Taping. Wear shoes that have a wide toe box. Avoid wearing tight shoes or shoes with high heels. Take your medicines only as told. Do not smoke, vape, or use nicotine or tobacco. Keep all follow-up visits. Your provider will check if the treatments are working. Contact a health care provider if: Your symptoms get worse. Your symptoms don't get better in 2 weeks. Get help right away if: You have very bad pain and trouble walking. This information is not intended to replace advice given to you by your health care provider. Make sure you discuss any questions you have with your health  care provider. Document Revised: 08/15/2022 Document Reviewed: 08/15/2022 Elsevier Patient Education  2024 [employer].

## 2023-12-14 NOTE — Progress Notes (Signed)
  Subjective:  Patient ID: Roberta Barrett, female    DOB: 08/20/1955,  MRN: [id number]  Chief Complaint  Patient presents with   Bunions    Pt stated that she feels like she is starting to get bunions on her foot she has no pain     Discussed the use of AI scribe software for clinical note transcription with the patient, who gave verbal consent to proceed.  History of Present Illness Roberta Barrett is a 68 year old female who presents with newly noticed bunions.  She has noticed bunions on her feet, which are currently asymptomatic. Her mother had bunions, raising concerns about potential hereditary progression. She has not sought treatment for the bunions yet. She underwent hammer toe surgery on her right foot, fourth toe, which affects shoe fitting. There is no pain when pressure is applied to the bunions or when the joint is moved.      Objective:  There were no vitals filed for this visit.  Physical Exam General: AAO x3, NAD  Dermatological: Skin is warm, dry and supple bilateral. There are no open sores, no preulcerative lesions, no rash or signs of infection present.  Vascular: Dorsalis Pedis artery and Posterior Tibial artery pedal pulses are 2/4 bilateral with immedate capillary fill time. There is no pain with calf compression, swelling, warmth, erythema.   Neruologic: Grossly intact via light touch bilateral.   Musculoskeletal: Mild bunion noted right > left.  Palpability noted at the first ray on the right side > left.  There is no tenderness palpation of the bunion area.  There is no pain or crepitation or restriction with first MTPJ range of motion.     No images are attached to the encounter.    Results RADIOLOGY Foot X-ray: Mild bunion deformity present.  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  There is nones of acute fracture bilaterally.  (12/14/2023)   Assessment:   1. Bunion      Plan:  Patient was evaluated and treated and all questions  answered.  Assessment and Plan Assessment & Plan Bunion of right and left foot Bilateral bunions, right more affected. Asymptomatic. X-rays show metatarsal shift.  Condition slowly progressive. - Recommend shoes with arch support and stability. - Advise against flat shoes or prolonged barefoot walking. - Suggest over-the-counter arch support inserts.  - Recommend exercises to slow progression, such as towel or band toe alignment. - Discuss potential surgery if bunions become painful or significantly deform the foot. - Provide information on shoe brands and stores for proper fitting, such as [employer] for stability shoes.   Return if symptoms worsen or fail to improve.   Roberta Barrett Fees DPM

## 2023-12-28 ENCOUNTER — Ambulatory Visit: Admitting: Nurse Practitioner

## 2023-12-28 ENCOUNTER — Ambulatory Visit: Payer: Self-pay

## 2023-12-28 ENCOUNTER — Ambulatory Visit: Payer: Self-pay | Admitting: Nurse Practitioner

## 2023-12-28 VITALS — BP 139/79 | HR 91 | Temp 98.0°F | Ht 61.0 in | Wt 145.0 lb

## 2023-12-28 DIAGNOSIS — J069 Acute upper respiratory infection, unspecified: Secondary | ICD-10-CM | POA: Diagnosis not present

## 2023-12-28 LAB — VERITOR SARS-COV-2 AND FLU A+B
BD Veritor SARS-CoV-2 Ag: NEGATIVE
Influenza A: NEGATIVE
Influenza B: NEGATIVE

## 2023-12-28 MED ORDER — DOXYCYCLINE HYCLATE 100 MG PO TABS: 100.0000 mg | ORAL_TABLET | Freq: Two times a day (BID) | ORAL | 0 refills | Status: AC

## 2023-12-28 MED ORDER — BENZONATATE 100 MG PO CAPS: 100.0000 mg | ORAL_CAPSULE | Freq: Two times a day (BID) | ORAL | 0 refills | Status: AC | PRN

## 2023-12-28 NOTE — Progress Notes (Signed)
 Subjective:    Patient ID: Roberta Barrett, female    DOB: 30-Sep-1955, 68 y.o.   MRN: [id number]   Chief Complaint: Fever and Cough   URI  This is a new problem. The current episode started in the past 7 days. The problem has been waxing and waning. The maximum temperature recorded prior to her arrival was 100.4 - 100.9 F. Associated symptoms include congestion, coughing and rhinorrhea. Pertinent negatives include no sinus pain or sore throat. Associated symptoms comments: Very hoarse. She has tried decongestant for the symptoms. The treatment provided mild relief.    Patient Active Problem List   Diagnosis Date Noted   Osteoporosis 01/07/2022   Papanicolaou smear of cervix with positive high risk human papilloma virus (HPV) test 12/30/2021   Elevated BP without diagnosis of hypertension 12/23/2021   Vaginal atrophy 12/23/2021   Special screening for malignant neoplasms, colon 12/21/2020   Postmenopause 12/05/2020   Encounter for screening fecal occult blood testing 12/05/2019   Encounter for gynecological examination with Papanicolaou smear of cervix 12/02/2018   Screening for colorectal cancer 09/21/2017   PMB (postmenopausal bleeding) 09/18/2016   Elevated cholesterol 09/18/2016   Encounter for routine gynecological examination with Papanicolaou smear of cervix 09/17/2015   Shingles 12/08/2014   Encounter for routine gynecological examination 09/05/2013   Atrophic vaginitis 09/05/2013   Family history of cervical cancer 09/01/2012       Review of Systems  Constitutional:  Positive for chills and fever.  HENT:  Positive for congestion and rhinorrhea. Negative for sinus pain and sore throat.   Respiratory:  Positive for cough.        Objective:   Physical Exam Cardiovascular:     Rate and Rhythm: Normal rate and regular rhythm.     Heart sounds: Normal heart sounds.  Pulmonary:     Breath sounds: Normal breath sounds.  Skin:    General: Skin is warm.   Neurological:     General: No focal deficit present.     Mental Status: She is alert and oriented to person, place, and time.  Psychiatric:        Mood and Affect: Mood normal.        Behavior: Behavior normal.     BP 139/79   Pulse 91   Temp 98 F (36.7 C) (Temporal)   Ht 5' 1 (1.549 m)   Wt 145 lb (65.8 kg)   SpO2 100%   BMI 27.40 kg/m   COvid- negative Flu negative     Assessment & Plan:   Niva P Iacobucci in today with chief complaint of Fever and Cough   1. URI with cough and congestion (Primary) 1. Take meds as prescribed 2. Use a cool mist humidifier especially during the winter months and when heat has been humid. 3. Use saline nose sprays frequently 4. Saline irrigations of the nose can be very helpful if done frequently.  * 4X daily for 1 week*  * Use of a nettie pot can be helpful with this. Follow directions with this* 5. Drink plenty of fluids 6. Keep thermostat turn down low 7.For any cough or congestion- tessalon perles 8. For fever or aces or pains- take tylenol or ibuprofen appropriate for age and weight.  * for fevers greater than 101 orally you may alternate ibuprofen and tylenol every  3 hours.    - Veritor SARS-CoV-2 and Flu A+B - doxycycline  (VIBRA -TABS) 100 MG tablet; Take 1 tablet (100 mg total) by mouth 2 (two)  times daily.  Dispense: 20 tablet; Refill: 0 - benzonatate (TESSALON) 100 MG capsule; Take 1 capsule (100 mg total) by mouth 2 (two) times daily as needed for cough.  Dispense: 20 capsule; Refill: 0    The above assessment and management plan was discussed with the patient. The patient verbalized understanding of and has agreed to the management plan. Patient is aware to call the clinic if symptoms persist or worsen. Patient is aware when to return to the clinic for a follow-up visit. Patient educated on when it is appropriate to go to the emergency department.   Mary-Margaret Gladis, FNP

## 2023-12-28 NOTE — Patient Instructions (Signed)

## 2023-12-28 NOTE — Telephone Encounter (Signed)
 FYI Only or Action Required?: FYI only for provider: appointment scheduled on 12/28/2023.  Patient was last seen in primary care on 08/27/2023 by Severa Rock HERO, FNP.  Called Nurse Triage reporting Cough.  Symptoms began 1 to 2 weeks ago and worsengng.  Interventions attempted: Nothing.  Symptoms are: gradually worsening.  Triage Disposition: See HCP Within 4 Hours (Or PCP Triage)  Patient/caregiver understands and will follow disposition?: Yes    Copied from CRM #[id number]. Topic: Clinical - Red Word Triage >> Dec 28, 2023 10:53 AM Zane F wrote: [facility] that prompted transfer to Nurse Triage:   Concern: discolored mucous ( color has changed from yellow, brown to green); pain in the ears off and on  Symptoms:   Losing voice (voice in and out since Thursday) Low Grade Fever: 99-100.4 ( 99.4 this morning) Runny Nose ( little specs of blood when she blows her nose) Cough Pain in ears off and on   When did the symptoms start?: Wednesday  What have you done to aid in the concern ? Have you taken anything to assist with the matter?: Yes  If so, what did you take?: Tylenol for fever, levocetirizine (XYZAL ) 5 MG tablet  Wanted to let you know I will be transferring you to further discuss your concern. Please be advised the nurse can assist with scheduling. Reason for Disposition  [1] Fever > 101 F (38.3 C) AND [2] age > 60 years  Answer Assessment - Initial Assessment Questions 1. ONSET: When did the cough begin?      X 1 to 2 weeks and worseninig 2. SEVERITY: How bad is the cough today?      moderate 3. SPUTUM: Describe the color of your sputum (e.g., none, dry cough; clear, white, yellow, green)     Yellow, green, brown 4. HEMOPTYSIS: Are you coughing up any blood? If Yes, ask: How much? (e.g., flecks, streaks, tablespoons, etc.)     yes 5. DIFFICULTY BREATHING: Are you having difficulty breathing? If Yes, ask: How bad is it? (e.g., mild, moderate, severe)       no 6. FEVER: Do you have a fever? If Yes, ask: What is your temperature, how was it measured, and when did it start?     99-100.4 7. CARDIAC HISTORY: Do you have any history of heart disease? (e.g., heart attack, congestive heart failure)      na 8. LUNG HISTORY: Do you have any history of lung disease?  (e.g., pulmonary embolus, asthma, emphysema)     na 9. PE RISK FACTORS: Do you have a history of blood clots? (or: recent major surgery, recent prolonged travel, bedridden)     na 10. OTHER SYMPTOMS: Do you have any other symptoms? (e.g., runny nose, wheezing, chest pain)       Hoarseness, runny nose, bilateral ear pain, cobblestones in throat 11. PREGNANCY: Is there any chance you are pregnant? When was your last menstrual period?       na 12. TRAVEL: Have you traveled out of the country in the last month? (e.g., travel history, exposures)       na  Protocols used: Cough - Acute Productive-A-AH

## 2023-12-28 NOTE — Telephone Encounter (Signed)
 Appt made.

## 2024-01-04 ENCOUNTER — Encounter: Payer: Self-pay | Admitting: Adult Health

## 2024-01-04 ENCOUNTER — Ambulatory Visit (INDEPENDENT_AMBULATORY_CARE_PROVIDER_SITE_OTHER): Admitting: Adult Health

## 2024-01-04 ENCOUNTER — Other Ambulatory Visit (HOSPITAL_COMMUNITY)
Admission: RE | Admit: 2024-01-04 | Discharge: 2024-01-04 | Disposition: A | Source: Ambulatory Visit | Attending: Adult Health | Admitting: Adult Health

## 2024-01-04 VITALS — BP 160/80 | HR 78 | Ht 61.0 in | Wt 146.5 lb

## 2024-01-04 DIAGNOSIS — Z01419 Encounter for gynecological examination (general) (routine) without abnormal findings: Secondary | ICD-10-CM | POA: Diagnosis present

## 2024-01-04 DIAGNOSIS — M810 Age-related osteoporosis without current pathological fracture: Secondary | ICD-10-CM | POA: Diagnosis not present

## 2024-01-04 DIAGNOSIS — N952 Postmenopausal atrophic vaginitis: Secondary | ICD-10-CM

## 2024-01-04 DIAGNOSIS — R030 Elevated blood-pressure reading, without diagnosis of hypertension: Secondary | ICD-10-CM

## 2024-01-04 MED ORDER — RISEDRONATE SODIUM 150 MG PO TABS: 150.0000 mg | ORAL_TABLET | ORAL | 12 refills | Status: AC

## 2024-01-04 NOTE — Progress Notes (Signed)
 Patient ID: Roberta Barrett, female   DOB: Jun 10, 1955, 68 y.o.   MRN: [id number] History of Present Illness: Roberta Barrett is a 68 year old white female, married, PM in for a well woman gyn exam and pap, at her request.  PCP is Dr Zollie   Current Medications, Allergies, Past Medical History, Past Surgical History, Family History and Social History were reviewed in [employer] electronic medical record.     Review of Systems: Patient denies any headaches, hearing loss, fatigue, blurred vision, shortness of breath, chest pain, abdominal pain, problems with bowel movements, urination, or intercourse(not active). No joint pain or mood swings.  Denies any vaginal bleeding, wiped light pink once in August after voiding, none since   Physical Exam:BP (!) 160/80 (BP Location: Left Arm, Patient Position: Sitting, Cuff Size: Normal)   Pulse 78   Ht 5' 1 (1.549 m)   Wt 146 lb 8 oz (66.5 kg)   BMI 27.68 kg/m   General:  Well developed, well nourished, no acute distress Skin:  Warm and dry Neck:  Midline trachea, normal thyroid, good ROM, no lymphadenopathy, no carotid bruits heard  Lungs; Clear to auscultation bilaterally Breast:  No dominant palpable mass, retraction, or nipple discharge Cardiovascular: Regular rate and rhythm Abdomen:  Soft, non tender, no hepatosplenomegaly Pelvic:  External genitalia is normal in appearance, no lesions.  The vagina is atrophic. Urethra has no lesions or masses. The cervix is smooth and atrophic with stenotic os.  Uterus is felt to be normal size, shape, and contour.  No adnexal masses or tenderness noted.Bladder is non tender, no masses felt. Rectal:deferred Extremities/musculoskeletal:  No swelling or varicosities noted, no clubbing or cyanosis Psych:  No mood changes, alert and cooperative,seems happy AA is 0 Fall risk is low    01/04/2024   10:43 AM 12/28/2023   12:30 PM 08/27/2023   10:57 AM  Depression screen PHQ 2/9  Decreased Interest 0 0 0   Down, Depressed, Hopeless 0 0 0  PHQ - 2 Score 0 0 0  Altered sleeping 0  0  Tired, decreased energy 0  0  Change in appetite 0  0  Feeling bad or failure about yourself  0  0  Trouble concentrating 0  0  Moving slowly or fidgety/restless 0  0  Suicidal thoughts 0  0  PHQ-9 Score 0  0   Difficult doing work/chores   Not difficult at all     Data saved with a previous flowsheet row definition       01/04/2024   10:44 AM 08/27/2023   10:58 AM 12/26/2022    9:46 AM 12/26/2022    9:45 AM  GAD 7 : Generalized Anxiety Score  Nervous, Anxious, on Edge 0 0 0 0  Control/stop worrying 0 0 0 0  Worry too much - different things 0 0 0 0  Trouble relaxing 0 0 0 0  Restless 0 0 0 0  Easily annoyed or irritable 0 0 0 0  Afraid - awful might happen 0 0 0 0  Total GAD 7 Score 0 0 0 0  Anxiety Difficulty  Not difficult at all      Upstream - 01/04/24 1055       Pregnancy Intention Screening   Does the patient want to become pregnant in the next year? N/A    Does the patient's partner want to become pregnant in the next year? N/A    Would the patient like to discuss contraceptive options today?  N/A      Contraception Wrap Up   Current Method Post-Menopause    End Method Post-Menopause    Contraception Counseling Provided No           Examination chaperoned by Clarita Salt LPN   Impression and plan: 1. Encounter for gynecological examination with Papanicolaou smear of cervix (Primary) Pap sent Physical in 1 year Labs with PCP Mammogram was negative 05/08/23 Colonoscopy per GI had 2024  Stay active  - Cytology - PAP( [name]  2. Vaginal atrophy  3. Elevated BP without diagnosis of hypertension Keep check on BP at home was 126/78 this morning Follow up with PCP  4. Age-related osteoporosis without current pathological fracture DEX scan scheduled for her 01/25/24 at 9 am at [facility] Refilled actone Meds ordered this encounter  Medications   risedronate  (ACTONEL ) 150 MG  tablet    Sig: Take 1 tablet (150 mg total) by mouth every 30 (thirty) days. with water  on empty stomach, nothing by mouth or lie down for next 30 minutes.    Dispense:  1 tablet    Refill:  12    Supervising Provider:   JAYNE MINDER H [[id number]]    - DG BONE DENSITY (DXA); Future

## 2024-01-08 LAB — CYTOLOGY - PAP
Adequacy: ABSENT
Comment: NEGATIVE
Diagnosis: NEGATIVE
High risk HPV: NEGATIVE

## 2024-01-11 ENCOUNTER — Ambulatory Visit: Payer: Self-pay | Admitting: Adult Health

## 2024-01-25 ENCOUNTER — Ambulatory Visit (HOSPITAL_COMMUNITY)
Admission: RE | Admit: 2024-01-25 | Discharge: 2024-01-25 | Disposition: A | Source: Ambulatory Visit | Attending: Adult Health | Admitting: Adult Health

## 2024-01-25 DIAGNOSIS — M810 Age-related osteoporosis without current pathological fracture: Secondary | ICD-10-CM | POA: Insufficient documentation

## 2024-02-29 ENCOUNTER — Encounter: Payer: Self-pay | Admitting: Family Medicine

## 2024-02-29 DIAGNOSIS — R0982 Postnasal drip: Secondary | ICD-10-CM

## 2024-02-29 DIAGNOSIS — J301 Allergic rhinitis due to pollen: Secondary | ICD-10-CM

## 2024-03-04 ENCOUNTER — Telehealth: Payer: Self-pay | Admitting: Family Medicine

## 2024-03-04 NOTE — Telephone Encounter (Signed)
 This is a duplicate message. Prior message is waiting on Dr. Zollie approval. Will close this note.

## 2024-03-04 NOTE — Telephone Encounter (Signed)
 Copied from CRM #[id number]. Topic: Referral - Request for Referral >> Mar 04, 2024  1:24 PM Miquel SAILOR wrote: Did the patient discuss referral with their provider in the last year? No (If No - schedule appointment) (If Yes - send message)  Appointment offered? No Pt seen on 12/28/23 and put request for ENT referral on MyChart. Needs call back on update. [phone]  Type of order/referral and detailed reason for visit: ENT   Preference of office, provider, location: PCP [facility] in [place], [place] [address] [place], [place], [place] [address] suite [phone]  If referral order, have you been seen by this specialty before? No (If Yes, this issue or another issue? When? Where?  Can we respond through MyChart? Yes

## 2024-04-13 ENCOUNTER — Institutional Professional Consult (permissible substitution) (INDEPENDENT_AMBULATORY_CARE_PROVIDER_SITE_OTHER): Admitting: Otolaryngology

## 2024-07-05 ENCOUNTER — Encounter: Payer: Self-pay | Admitting: Family Medicine
